# Patient Record
Sex: Female | Born: 1940 | Race: White | Hispanic: No | Smoking: Never smoker
Health system: Southern US, Community
[De-identification: ages and names within clinical notes are randomized; demographics above are authoritative.]

## PROBLEM LIST (undated history)

## (undated) DIAGNOSIS — M8580 Other specified disorders of bone density and structure, unspecified site: Secondary | ICD-10-CM

## (undated) DIAGNOSIS — S92919A Unspecified fracture of unspecified toe(s), initial encounter for closed fracture: Secondary | ICD-10-CM

## (undated) DIAGNOSIS — I447 Left bundle-branch block, unspecified: Secondary | ICD-10-CM

## (undated) DIAGNOSIS — A0472 Enterocolitis due to Clostridium difficile, not specified as recurrent: Secondary | ICD-10-CM

## (undated) HISTORY — PX: ABDOMINAL HYSTERECTOMY: SHX81

## (undated) HISTORY — PX: APPENDECTOMY: SHX54

## (undated) HISTORY — DX: Unspecified fracture of unspecified toe(s), initial encounter for closed fracture: S92.919A

## (undated) HISTORY — PX: BREAST BIOPSY: SHX20

## (undated) HISTORY — PX: TUBAL LIGATION: SHX77

## (undated) HISTORY — DX: Other specified disorders of bone density and structure, unspecified site: M85.80

---

## 1998-03-07 ENCOUNTER — Ambulatory Visit (HOSPITAL_COMMUNITY): Admission: RE | Admit: 1998-03-07 | Discharge: 1998-03-07 | Payer: Self-pay | Admitting: Obstetrics and Gynecology

## 1999-02-26 ENCOUNTER — Ambulatory Visit (HOSPITAL_COMMUNITY): Admission: RE | Admit: 1999-02-26 | Discharge: 1999-02-26 | Payer: Self-pay | Admitting: Surgery

## 1999-07-09 ENCOUNTER — Other Ambulatory Visit: Admission: RE | Admit: 1999-07-09 | Discharge: 1999-07-09 | Payer: Self-pay | Admitting: Obstetrics and Gynecology

## 2000-03-03 ENCOUNTER — Encounter: Payer: Self-pay | Admitting: Obstetrics and Gynecology

## 2000-03-03 ENCOUNTER — Ambulatory Visit (HOSPITAL_COMMUNITY): Admission: RE | Admit: 2000-03-03 | Discharge: 2000-03-03 | Payer: Self-pay | Admitting: Obstetrics and Gynecology

## 2000-04-15 ENCOUNTER — Other Ambulatory Visit: Admission: RE | Admit: 2000-04-15 | Discharge: 2000-04-15 | Payer: Self-pay | Admitting: Obstetrics and Gynecology

## 2000-04-21 ENCOUNTER — Encounter: Admission: RE | Admit: 2000-04-21 | Discharge: 2000-04-21 | Payer: Self-pay | Admitting: Obstetrics and Gynecology

## 2000-04-21 ENCOUNTER — Encounter: Payer: Self-pay | Admitting: Obstetrics and Gynecology

## 2000-12-30 ENCOUNTER — Encounter: Payer: Self-pay | Admitting: Internal Medicine

## 2000-12-30 ENCOUNTER — Ambulatory Visit (HOSPITAL_COMMUNITY): Admission: RE | Admit: 2000-12-30 | Discharge: 2000-12-30 | Payer: Self-pay | Admitting: Internal Medicine

## 2001-03-09 ENCOUNTER — Encounter: Payer: Self-pay | Admitting: Obstetrics and Gynecology

## 2001-03-09 ENCOUNTER — Encounter: Admission: RE | Admit: 2001-03-09 | Discharge: 2001-03-09 | Payer: Self-pay | Admitting: Obstetrics and Gynecology

## 2001-04-06 ENCOUNTER — Other Ambulatory Visit: Admission: RE | Admit: 2001-04-06 | Discharge: 2001-04-06 | Payer: Self-pay | Admitting: Obstetrics and Gynecology

## 2001-04-13 ENCOUNTER — Encounter: Payer: Self-pay | Admitting: Internal Medicine

## 2001-04-13 ENCOUNTER — Ambulatory Visit (HOSPITAL_COMMUNITY): Admission: RE | Admit: 2001-04-13 | Discharge: 2001-04-13 | Payer: Self-pay | Admitting: Internal Medicine

## 2001-04-20 ENCOUNTER — Encounter: Payer: Self-pay | Admitting: Internal Medicine

## 2001-04-20 ENCOUNTER — Ambulatory Visit (HOSPITAL_COMMUNITY): Admission: RE | Admit: 2001-04-20 | Discharge: 2001-04-20 | Payer: Self-pay | Admitting: Internal Medicine

## 2002-03-15 ENCOUNTER — Encounter: Payer: Self-pay | Admitting: Internal Medicine

## 2002-03-15 ENCOUNTER — Encounter: Admission: RE | Admit: 2002-03-15 | Discharge: 2002-03-15 | Payer: Self-pay | Admitting: Internal Medicine

## 2002-09-07 ENCOUNTER — Emergency Department (HOSPITAL_COMMUNITY): Admission: EM | Admit: 2002-09-07 | Discharge: 2002-09-07 | Payer: Self-pay | Admitting: Emergency Medicine

## 2002-10-04 ENCOUNTER — Encounter: Payer: Self-pay | Admitting: Internal Medicine

## 2002-10-04 ENCOUNTER — Emergency Department (HOSPITAL_COMMUNITY): Admission: EM | Admit: 2002-10-04 | Discharge: 2002-10-04 | Payer: Self-pay

## 2002-10-05 ENCOUNTER — Inpatient Hospital Stay (HOSPITAL_COMMUNITY): Admission: EM | Admit: 2002-10-05 | Discharge: 2002-10-08 | Payer: Self-pay | Admitting: Emergency Medicine

## 2002-11-24 ENCOUNTER — Ambulatory Visit (HOSPITAL_COMMUNITY): Admission: RE | Admit: 2002-11-24 | Discharge: 2002-11-24 | Payer: Self-pay | Admitting: Internal Medicine

## 2002-11-24 ENCOUNTER — Encounter: Payer: Self-pay | Admitting: Internal Medicine

## 2002-12-10 ENCOUNTER — Encounter: Payer: Self-pay | Admitting: Internal Medicine

## 2002-12-10 ENCOUNTER — Ambulatory Visit (HOSPITAL_COMMUNITY): Admission: RE | Admit: 2002-12-10 | Discharge: 2002-12-10 | Payer: Self-pay | Admitting: Internal Medicine

## 2002-12-13 ENCOUNTER — Encounter: Payer: Self-pay | Admitting: Internal Medicine

## 2002-12-13 ENCOUNTER — Ambulatory Visit (HOSPITAL_COMMUNITY): Admission: RE | Admit: 2002-12-13 | Discharge: 2002-12-13 | Payer: Self-pay | Admitting: Internal Medicine

## 2002-12-23 ENCOUNTER — Emergency Department (HOSPITAL_COMMUNITY): Admission: EM | Admit: 2002-12-23 | Discharge: 2002-12-23 | Payer: Self-pay | Admitting: Emergency Medicine

## 2003-01-27 ENCOUNTER — Emergency Department (HOSPITAL_COMMUNITY): Admission: EM | Admit: 2003-01-27 | Discharge: 2003-01-27 | Payer: Self-pay

## 2003-03-14 ENCOUNTER — Ambulatory Visit (HOSPITAL_COMMUNITY): Admission: RE | Admit: 2003-03-14 | Discharge: 2003-03-14 | Payer: Self-pay | Admitting: Internal Medicine

## 2003-04-04 ENCOUNTER — Encounter: Admission: RE | Admit: 2003-04-04 | Discharge: 2003-04-04 | Payer: Self-pay | Admitting: Internal Medicine

## 2003-04-04 ENCOUNTER — Encounter: Payer: Self-pay | Admitting: Internal Medicine

## 2003-05-31 ENCOUNTER — Encounter: Payer: Self-pay | Admitting: Surgery

## 2003-05-31 ENCOUNTER — Encounter: Admission: RE | Admit: 2003-05-31 | Discharge: 2003-05-31 | Payer: Self-pay | Admitting: Surgery

## 2004-04-09 ENCOUNTER — Encounter: Admission: RE | Admit: 2004-04-09 | Discharge: 2004-04-09 | Payer: Self-pay | Admitting: Obstetrics and Gynecology

## 2004-08-06 ENCOUNTER — Encounter (HOSPITAL_COMMUNITY): Admission: RE | Admit: 2004-08-06 | Discharge: 2004-09-13 | Payer: Self-pay | Admitting: Internal Medicine

## 2004-09-27 ENCOUNTER — Encounter: Admission: RE | Admit: 2004-09-27 | Discharge: 2004-09-27 | Payer: Self-pay | Admitting: Internal Medicine

## 2004-10-01 ENCOUNTER — Ambulatory Visit (HOSPITAL_COMMUNITY): Admission: RE | Admit: 2004-10-01 | Discharge: 2004-10-01 | Payer: Self-pay | Admitting: Internal Medicine

## 2004-11-26 ENCOUNTER — Ambulatory Visit (HOSPITAL_COMMUNITY): Admission: RE | Admit: 2004-11-26 | Discharge: 2004-11-26 | Payer: Self-pay | Admitting: Gastroenterology

## 2004-11-26 ENCOUNTER — Encounter (INDEPENDENT_AMBULATORY_CARE_PROVIDER_SITE_OTHER): Payer: Self-pay | Admitting: *Deleted

## 2005-04-22 ENCOUNTER — Encounter: Admission: RE | Admit: 2005-04-22 | Discharge: 2005-04-22 | Payer: Self-pay | Admitting: Obstetrics and Gynecology

## 2006-01-15 ENCOUNTER — Encounter: Payer: Self-pay | Admitting: Surgery

## 2006-02-06 ENCOUNTER — Other Ambulatory Visit: Admission: RE | Admit: 2006-02-06 | Discharge: 2006-02-06 | Payer: Self-pay | Admitting: Obstetrics & Gynecology

## 2006-04-28 ENCOUNTER — Encounter: Admission: RE | Admit: 2006-04-28 | Discharge: 2006-04-28 | Payer: Self-pay | Admitting: Obstetrics & Gynecology

## 2006-10-20 ENCOUNTER — Encounter: Admission: RE | Admit: 2006-10-20 | Discharge: 2006-10-20 | Payer: Self-pay | Admitting: Obstetrics & Gynecology

## 2006-12-24 ENCOUNTER — Encounter: Admission: RE | Admit: 2006-12-24 | Discharge: 2006-12-24 | Payer: Self-pay | Admitting: Gastroenterology

## 2007-05-12 ENCOUNTER — Encounter: Admission: RE | Admit: 2007-05-12 | Discharge: 2007-05-12 | Payer: Self-pay | Admitting: Obstetrics & Gynecology

## 2007-10-06 ENCOUNTER — Encounter: Admission: RE | Admit: 2007-10-06 | Discharge: 2007-10-06 | Payer: Self-pay | Admitting: Internal Medicine

## 2008-01-27 ENCOUNTER — Emergency Department (HOSPITAL_COMMUNITY): Admission: EM | Admit: 2008-01-27 | Discharge: 2008-01-27 | Payer: Self-pay | Admitting: Emergency Medicine

## 2008-02-01 ENCOUNTER — Other Ambulatory Visit: Admission: RE | Admit: 2008-02-01 | Discharge: 2008-02-01 | Payer: Self-pay | Admitting: Obstetrics & Gynecology

## 2008-06-06 ENCOUNTER — Encounter: Admission: RE | Admit: 2008-06-06 | Discharge: 2008-06-06 | Payer: Self-pay | Admitting: Obstetrics & Gynecology

## 2008-06-15 ENCOUNTER — Emergency Department (HOSPITAL_COMMUNITY): Admission: EM | Admit: 2008-06-15 | Discharge: 2008-06-15 | Payer: Self-pay | Admitting: Emergency Medicine

## 2008-06-28 ENCOUNTER — Encounter: Admission: RE | Admit: 2008-06-28 | Discharge: 2008-06-28 | Payer: Self-pay | Admitting: Gastroenterology

## 2008-07-19 ENCOUNTER — Emergency Department (HOSPITAL_COMMUNITY): Admission: EM | Admit: 2008-07-19 | Discharge: 2008-07-19 | Payer: Self-pay | Admitting: Emergency Medicine

## 2009-06-07 ENCOUNTER — Encounter: Admission: RE | Admit: 2009-06-07 | Discharge: 2009-06-07 | Payer: Self-pay | Admitting: Obstetrics & Gynecology

## 2010-06-12 ENCOUNTER — Encounter: Admission: RE | Admit: 2010-06-12 | Discharge: 2010-06-12 | Payer: Self-pay | Admitting: Obstetrics and Gynecology

## 2010-10-07 ENCOUNTER — Encounter: Payer: Self-pay | Admitting: Obstetrics & Gynecology

## 2011-02-01 NOTE — Discharge Summary (Signed)
NAME:  Sylvia Kennedy, Sylvia Kennedy                          ACCOUNT NO.:  [id number]   MEDICAL RECORD NO.:  [id number]                   PATIENT TYPE:  INP   LOCATION:  0374                                 FACILITY:  [facility]   PHYSICIAN:  Iva Boop, M.D. [facility]           DATE OF BIRTH:  05-Dec-1940   DATE OF ADMISSION:  10/05/2002  DATE OF DISCHARGE:  10/08/2002                                 DISCHARGE SUMMARY   ADMITTING DIAGNOSES:  50. A 70 year old female with acute upper gastrointestinal bleed status post     upper endoscopy with Elease Hashimoto dilation of the distal esophagus and biopsy     of gastrocardia polyp on October 04, 2002 per Vania Rea. Jarold Motto, M.D.     [facility]  2. Chronic gastroesophageal reflux disease.  3. Status post appendectomy and hysterectomy.   DISCHARGE DIAGNOSES:  1. Stable status post acute upper gastrointestinal bleed secondary to     esophageal laceration at dilation site with associated visible vessel.  2. Anemia secondary to above status post transfusion.  3. Chronic anxiety.  4. Chronic gastroesophageal reflux disease.  5. Status post appendectomy and hysterectomy.   CONSULTATIONS:  None.   PROCEDURE:  Upper endoscopy with epinephrine injection, bicap and Endo  clipping per Iva Boop, M.D. [facility] October 05, 2002.   BRIEF HISTORY:  The patient is a very nice 70 year old white female known to  [employer]. Jarold Motto, M.D. [facility] with history of chronic GERD.  She is status  post appendectomy, bilateral tubal ligation, and hysterectomy.  She had been  on chronic Nexium 40 mg b.i.d.  She had undergone upper endoscopy with Vania Rea. Jarold Motto, M.D. 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  She was also noted to have an  irregular V-line which was biopsied and a small polyp in the gastrocardia  approximately 4 mm and sessile which was removed via hot biopsy.  The  patient tolerated the procedure well.  Was discharged to  home from the  __________.  Last evening she ate dinner and then vomited up a small blood  clot early in the evening.  She called and was advised to come to the  emergency room for evaluation.  She was seen and evaluated by Wilhemina Bonito. Marina Goodell,  M.D. [facility] who was on-call.  Was noted to be hemodynamically quite stable  without any evidence of active bleeding.  Barium swallow was done showing no  evidence of a tear or leak and she was allowed discharge to home.  However,  after returning home she vomited again x2 with small amounts of bright red  blood, a couple of tablespoons each time per the patient, and then started  having loose stools with multiple episodes during the night.  She says all  of these were black.  She eventually developed urgency and clamminess with  these episodes, but no  syncope.  She had progressive weakness and came back  to the emergency room.  She was hemodynamically stable, but tachycardic.  CBC showed a hemoglobin of 12.9, hematocrit of 36.1.  Coags were within  normal limits.  Stool was noted to be black and heme-positive per the ER  physician.  She denied any abdominal pain, chest pain, dysphagia, shortness  of breath, etc.  She was seen and evaluated and admitted to the hospital for  observation and probable repeat upper endoscopy with therapeutic  intervention if indicated.  At the time of admission she was hesitant for  repeat procedure.   LABORATORY STUDIES:  On October 05, 2002 WBC of 9.6, hemoglobin 12.9,  hematocrit 36.1, MCV 86.4, platelets 242,000.  Serial values were obtained  later on January 20.  Hemoglobin down to 10.3, hematocrit of 29.3.  Follow-  up on January 21:  Hemoglobin 11.5, hematocrit 32.7.  She had multiple H&Hs  drawn.  On October 07, 2002 hemoglobin 11.4, hematocrit 33.5.  On January 23  hemoglobin 11.5, hematocrit 32.3.  Pro time 13.7, INR 1.0, PTT 28.  Electrolytes within normal limits.  On admission BUN was 28, creatinine 0.7,  albumin  3.7.  Liver function studies normal with the exception of a total  bilirubin at 1.5, lipase normal at 20.   HOSPITAL COURSE:  The patient was admitted to the service of Iva Boop, M.D. [facility] who was covering the hospital.  She had initially been  evaluated in the emergency room.  She was kept n.p.o. and at bed rest,  placed on IV fluids and IV Protonix.  Initial hemoglobin on the morning of  admission was still within normal limits at 12.9.  We had discussed repeat  upper endoscopy as the primary means for diagnosing and treating her upper  GI bleed.  She was quite anxious and initially hesitant to undergo a repeat  procedure.  We waited a few hours and when it became evident that she was  still having active bleeding and had sustained a drop in her hemoglobin, she  was transfused 2 units of packed rbc's and further discussion was had with  the patient and her husband who at that time were quite willing to proceed  with repeat upper endoscopy.  This was done per Iva Boop, M.D. [facility] on  October 05, 2002 with finding of a laceration at the site of the esophageal  dilation.  There was a visible vessel present.  This was injected with  epinephrine, bicapped, and Endo clipped with successful hemostasis.  There  was some retained blood in the fundus.  There was no evidence of bleeding  site seen in the Cartia from where the polyp was removed and otherwise was  negative examination with the exception of the hiatal hernia.  The patient  was observed overnight in the intensive care unit.  Did not have any further  active bleeding and did not require any further transfusions.  The following  morning she was given sips of clear liquids and then later that day  transferred to a regular floor.  On January 22 she remained quite stable.  Her hemoglobin was stable at 11.6.  She was taking liquids without  difficulty and had no complaints of discomfort.  She was anxious and tearful at that time  and admitting to significant anxiety secondary to this  unanticipated hospital stay.  She was given Tranxene on a p.r.n. basis which  she had to use at home previously.  She was seen by Vania Rea. Jarold Motto, M.D.  [facility] on the morning of January 23 and again was quite stable at that time.  She was able to tolerate full liquids and hemoglobin was quite stable.  It  was felt that she could be discharged to home, but was not feeling up to a  discharge at that time.  She was reassured.  Later in the afternoon had  decided to stay until the following morning.  However, later that evening  after discussion with Iva Boop, M.D. [facility] patient was discharged to  home in a stable and improved condition with instructions to follow up with  Iva Boop, M.D. [facility] on Friday, January 30 at 2 p.m. and to call for  any problems in the interim.  She was to take it easy with no strenuous  exercise or heavy lifting for two weeks.  She was asked to stay on a full  liquid diet for 24 hours and then advance to a soft diet for two to three  days and then regular thereafter as she tolerated.   DISCHARGE MEDICATIONS:  1. Protonix 40 mg b.i.d. or Nexium 40 mg b.i.d. which she had had at home     previously.  2. Vivelle patch as previous.  3. Vitamins as previous.  4. Tranxene 3.75 mg p.o. t.i.d. p.r.n.  5. No aspirin, Advil, Aleve, or other anti-inflammatory medications.   She was asked to have a CBC drawn at the [facility] office laboratory prior to  her appointment on January 30.     Mike Gip, P.A.-C. [facility]                Iva Boop, M.D. [facility]    AE/MEDQ  D:  10/25/2002  T:  10/25/2002  Job:  [id number]

## 2011-02-01 NOTE — H&P (Signed)
NAMESOLE, LENGACHER                          ACCOUNT NO.:  [id number]   MEDICAL RECORD NO.:  [id number]                   PATIENT TYPE:  INP   LOCATION:  0152                                 FACILITY:  [facility]   PHYSICIAN:  Iva Boop, M.D. [facility]           DATE OF BIRTH:  03-08-1941   DATE OF ADMISSION:  10/05/2002  DATE OF DISCHARGE:                                HISTORY & PHYSICAL   CHIEF COMPLAINT:  Vomited blood and black stool, associated with weakness.   HISTORY:  Ms. Politano is a very nice, 70 year old, white female, known to  [employer]. Jarold Motto, M.D. with history of chronic GERD.  She is status post  appendectomy, bilateral tubal ligation, and hysterectomy.  She has been on  chronic Nexium 40 daily to b.i.d.  She underwent upper endoscopy at the [facility]  on 10/04/02 and had Maloney dilation done of a distal esophageal stricture  to 18 mm.  She was also noted to have an irregular Z-line which was biopsied  and a small polyp in the gastric cardia, 4 mm, sessile, which was removed  via hot biopsy.  The patient did well postprocedure and was discharged to  home.  Last evening she ate dinner and then vomited a small blood clot.  She  called and was advised to come to the emergency room for evaluation.  She  was seen and evaluated by Wilhemina Bonito. Marina Goodell, M.D., who was on call.  She was  hemodynamically quite stable.  Barium swallow was done showing no evidence  of tear or leak, and the patient was discharged to home.  After returning  home, she vomited again x 2 with small amounts of bright red blood,  approximately a couple of tablespoons per the patient.  She then started  with loose stools x 7 during the night, all of which were black.  She had  urgency and clamminess with these episodes but no syncope.  She says she  felt weak and a little fainty at times.  She came back to the emergency  room this morning, hemodynamically remains stable but has been tachycardic.  CBC shows a WBC of  9.6, hemoglobin 12.9, hematocrit 36.1.  Coags within  normal limits.  Stool is black and heme-positive per the ER physician,  Carren Rang, M.D.  She denies any abdominal pain, chest pain, dysphagia,  dyspnea, etc.  She is seen and evaluated and is admitted for observation and  probable repeat upper endoscopy with therapeutic intervention with upper GI  bleed post esophageal dilation.  It is unclear at this time whether she is  bleeding from a Mallory-Weiss type tear postdilation of the esophagus or  from the polyp site.   CURRENT MEDICATIONS:  1. Nexium 40 p.o. daily to b.i.d.  2. Vivelle patch.  3. She also on her medication list put occasional aspirin and Advil but none     over the past few  days.   ALLERGIES:  DARVOCET.   PAST MEDICAL HISTORY:  1. GERD.  2. Bilateral tubal ligation.  3. Status post hysterectomy and appendectomy.   FAMILY HISTORY:  Two grandmothers deceased with coronary artery disease.  Mother alive and well at 3.   SOCIAL HISTORY:  The patient is married.  No tobacco or ETOH.  She is  employed as a Associate Professor.   REVIEW OF SYSTEMS:  CARDIOVASCULAR:  Negative for chest pain or anginal  symptoms.  PULMONARY:  Negative for cough, shortness of breath, or sputum  production.  GENITOURINARY:  Negative for dysuria, urgency, or frequency.  HEENT:  Unremarkable.  MUSCULOSKELETAL:  Negative currently.  GI:  As above.   PHYSICAL EXAMINATION:  GENERAL:  Well-developed, white female, in no acute  distress.  She is alert and oriented x 3.  She is anxious.  VITAL SIGNS:  Temperature 97.2, blood pressure 136/60, pulse currently 90  was up to 110.  HEENT:  Nontraumatic, normocephalic.  EOMI.  PERRLA.  Sclerae anicteric.  NECK:  Supple and without nodes.  There is no JVD or bruit.  No crepitus.  CARDIOVASCULAR:  Regular rate and rhythm with S1 and S2.  No murmurs, rubs,  or gallops, tachy.  PULMONARY:  Clear.  No crepitus of chest wall.  ABDOMEN:  Soft and nontender.   There is no mass or hepatosplenomegaly.  Bowel sounds are active.  RECTAL:  Not repeated at this time.  Black, melenic stool per ER physician.  EXTREMITIES:  Without cyanosis, clubbing, or edema.  NEUROLOGIC:  Grossly nonfocal.   IMPRESSION:  3. A 70 year old white female with acute upper gastrointestinal bleed post     upper endoscopy with Maloney dilation of the distal esophagus and biopsy     of gastric cardia polyp on 10/04/02.  2. Chronic gastroesophageal reflux disease.  3. Status post appendectomy and hysterectomy.   PLAN:  The patient is admitted to the service of Iva Boop, M.D. for  IV fluid hydration, serial H&H's, IV Protonix, probable EGD with therapeutic  intervention today.  For details, please see the orders.     Mike Gip, P.A.-C. [facility]                Iva Boop, M.D. [facility]    AE/MEDQ  D:  10/05/2002  T:  10/05/2002  Job:  [id number]

## 2011-02-01 NOTE — Consult Note (Signed)
NAME:  Sylvia Kennedy, Sylvia Kennedy NO.:  [id number]   MEDICAL RECORD NO.:  [id number]                   PATIENT TYPE:  EMS   LOCATION:  ED                                   [facility]ACILITY[facility]  [facility]   PHYSICIAN:  Iva Boop, M.D. [facility]           DATE OF BIRTH:  05/21/1941   DATE OF CONSULTATION:  12/23/2002  DATE OF DISCHARGE:                                   CONSULTATION   GASTROENTEROLOGY CONSULTATION:   HISTORY:  The patient is a pleasant 70 year old white woman that I know from  previous endoscopy after she suffered bleeding from esophageal laceration  after endoscopy, biopsy, and dilation of the esophagus.  Ever since that  time she has had recurrent reflux-type symptoms and anxiety as well.  She  says that her symptoms are all worse since that episode approximately 6-8  weeks ago.  I do not have the records in front of me at this time.  She has  switched from various proton pump inhibitors, she has had a lot of burning  sensation in the mouth and the tongue without obvious pathology.  At one  point I thought I might have seen a small ulcer in her pharynx and she was  sent to Dr. Pollyann Kennedy of otolaryngology and from what she tells me he did not  think there was anything seriously wrong though her problems were related to  reflux.  She also has thyroid nodules and hyperthyroidism and is due to  follow up with Dr. Felipa Eth on that.  Saturday, about 6 days ago, she had an  episode of severe burning in the chest up into the throat and the nose area,  she used some Maalox and felt somewhat better.  She has recently switched  back to Protonix from Nexium and she is taking Protonix 40 mg b.i.d.  She  was also started on Lexapro recently that was recommended weeks ago and she  finally started that and overnight she developed burning in her chest wall  and her arms and really more of a hot sensation.  She said her arms and neck  and face were somewhat red according to her  husband.  That has resolved.   PHYSICAL EXAMINATION:  GENERAL:  She is in no distress at this time.  VITAL SIGNS:  Temperature 98.2, pulse 78, blood pressure 136/73,  respirations 18.  LUNGS:  Clear.  NECK:  Supple; no obvious mass or tenderness.  HEENT:  The mouth and posterior pharynx look free of lesions.  HEART:  S1 and S2; no rubs, murmurs, or gallops.   REVIEW OF SYSTEMS:  She denies any particular dyspnea or palpitations or  tachycardia.   ASSESSMENT:  1. Gastroesophageal reflux disease.  2. Anxiety.  3. Hyperthyroidism, per the patient's history.  4. Adverse reaction/side effects to Lexapro is the most likely cause of the     symptoms she had overnight.   PLAN:  This is somewhat complicated.  Clearly her symptoms are a lot worse  since her endoscopic complication and I think there is a certain amount of  anxiety and/or posttraumatic stress disorder problems related to that.  She  does have acid reflux and short segment Barrett's esophagus.  I am not  convinced all of her symptoms are related to reflux.  I have suggested the  possibility of a pH probe test but I think she should have her thyroid  issues addressed first as that can certainly contribute to some of her  problems and ultimately I think she would be best served by  some sort of anxiolytic/antidepressant.  I have recommended she increase her  Tranxene to a half a tablet b.i.d.  She says a whole tablet (currently  taking half tablet once a day) causes too much of an effect.  She has an  established followup with me later this month and she can call if problems  arise sooner.                                               Iva Boop, M.D. [facility]    CEG/MEDQ  D:  12/23/2002  T:  12/24/2002  Job:  [id number]   cc:   Larina Earthly, M.D.  [address]  [place]  [place] [address]  Fax: [phone]   Jeannett Senior. Pollyann Kennedy, M.D.  [address] [place]  [place] [address]  Fax: [phone]

## 2011-02-01 NOTE — Op Note (Signed)
Sylvia Kennedy, LANUM              ACCOUNT NO.:  [id number]   MEDICAL RECORD NO.:  [id number]          PATIENT TYPE:  AMB   LOCATION:  ENDO                         FACILITY:  [facility]   PHYSICIAN:  Bernette Redbird, M.D.   DATE OF BIRTH:  1941-03-21   DATE OF PROCEDURE:  11/26/2004  DATE OF DISCHARGE:                                 OPERATIVE REPORT   PROCEDURE:  Upper endoscopy with biopsies.   INDICATION:  Questionable Barrett's esophagus with prior biopsies not  showing any intestinal metaplasia but with history of reflux, on chronic  Nexium. Confirm absence of intestinal metaplasia.   FINDINGS:  Minimal hiatal hernia. No endoscopic evidence of Barrett's, but  hiatal hernia mucosa biopsied.   PROCEDURE:  The nature, purpose and risks of the procedure had been  discussed with the patient, who provided written consent. Sedation was  fentanyl 75 mcg and Versed 8 milligrams IV without arrhythmias or  desaturation. The Olympus standard adult video endoscope was passed under  direct vision, entering the esophagus with mild resistance.   The esophageal mucosa was normal down to the squamocolumnar junction. There  was no evidence of free reflux, reflux esophagitis, nor any tongues of  Barrett's appearing mucosa, stricture, ring, varices, infection or  neoplasia. There was a 1 to 2 cm hiatal hernia present with circumferential  gastric mucosa comprising what appeared to be the hiatal hernia pouch, but  without any obvious Barrett's changes, so I am quite sure this was, indeed,  a hiatal hernia and not Barrett's mucosa. Nonetheless, sampling was obtained  to confirm that it was typical gastric cardia columnar mucosa without  intestinal metaplasia.   The stomach contained no significant no significant residual and had normal  mucosa without evidence of gastritis, erosions, ulcers, polyps or masses  including a retroflexed view of the cardia, and the pylorus, duodenal bulb  and second  duodenum looked normal.   The patient tolerated the procedure well and there was no apparent  complications.   IMPRESSION:  Small hiatal hernia but without classic tongues of Barrett's  mucosa.   PLAN:  Await pathology results.      RB/MEDQ  D:  11/26/2004  T:  11/26/2004  Job:  [id number]   cc:   Larina Earthly, M.D.  [address]  [place]  [place] [address]  Fax: [phone]

## 2011-02-01 NOTE — Op Note (Signed)
   NAME:  Sylvia Kennedy, Sylvia Kennedy                          ACCOUNT NO.:  [id number]   MEDICAL RECORD NO.:  [id number]                   PATIENT TYPE:  AMB   LOCATION:  ENDO                                 FACILITY:  [facility]   PHYSICIAN:  Iva Boop, M.D. [facility]           DATE OF BIRTH:  Jul 05, 1941   DATE OF PROCEDURE:  DATE OF DISCHARGE:                                 OPERATIVE REPORT   PROCEDURE:  24 hour ambulatory pH monitoring.   INDICATIONS FOR PROCEDURE:  Pharyngeal sensations, question proximal reflux.  The patient is on Prevacid 30 mg b.i.d.  She has had problems with dysphagia  in the past as well and odynophagia type symptoms. A dual sensor (15 cm  spacing) probe placed with the distal sensor at 33 cm.   FINDINGS:  There was no significant acid reflux recorded on either probe. It  was analyzed for 22 hours and 37 minutes. There were two reflux episodes  total in the proximal and one reported in the distal probe. These were very  transient lasting for less than one minute each. There was on significant  correlation with symptoms of heartburn or chest pain and the patient's  symptoms. She had no other symptoms. She had one episode of chest pain  reported and several episodes of heartburn which did not correlate with  reflux.   ASSESSMENT:  No significant acid reflux in a patient on twice daily proton  pump inhibitor therapy with persistent pharyngeal symptoms thought possibly  to be due to reflux. I think that is not the case.   RECOMMENDATIONS:  Given the patient's history of anxiety, I will ask the  patient to see counseling. I think this may help her with some of her  symptoms. Please see her esophageal manometry report as well.   I appreciate the opportunity to care for this patient.                                               Iva Boop, M.D. [facility]    CEG/MEDQ  D:  03/28/2003  T:  03/28/2003  Job:  [id number]   cc:   Larina Earthly, M.D.  [address]  [place]  [place] [address]  Fax: [phone]

## 2011-02-01 NOTE — Op Note (Signed)
   Sylvia Kennedy, Sylvia Kennedy                          ACCOUNT NO.:  [id number]   MEDICAL RECORD NO.:  [id number]                   PATIENT TYPE:  AMB   LOCATION:  ENDO                                 FACILITY:  [facility]   PHYSICIAN:  Iva Boop, M.D. [facility]           DATE OF BIRTH:  [birth year]   DATE OF PROCEDURE:  03/14/2003  DATE OF DISCHARGE:                                 OPERATIVE REPORT   PROCEDURE:  Esophageal manometry.   INDICATION:  Globus sensation.  Continued problems with possible atypical  reflux with pharyngeal sensations of uncertain etiology, despite proton pump  inhibitor therapy.   FINDINGS:  1. Lower esophageal sphincter pressure is normal at 36.5 mm.  Residual     pressure is reported at 8.4 mmHg, which is slightly high.  A percent     relaxation is indicated at 66%, which technically would be low, but     review of the tracing shows adequate relaxation in my opinion.  The     tracing did not plot out within the bars on the typical graft, there is     significant relaxation of the lower esophageal sphincter, in my opinion.  2. Lower esophageal sphincter length is 3 cm, ranging from 41-44 cm.  3. Esophageal body.  Both lower and upper esophageal body shows normal     peristalsis and appropriate amplitude of contractions.  There was one     nontransmitted contraction in the lower esophageal body and one     simultaneous contraction.  4. Upper esophageal sphincter shows a high pressure at 170.7 mmHg and a high     peak pressure in the pharynx of 191.8.  This is just slightly high with     top-normal being 190.  Percent relaxation in the upper esophageal     sphincter is 96%.   ASSESSMENT:  High pharyngeal and upper esophageal sphincter pressures, which  would correlate with the patient's sensation of globus that has been  reported.  The etiology of this is not clear at this point.  Based upon my  clinical analysis, I think the patient has underlying anxiety, which  has  been correlated with symptoms and problems such as this.                                               Iva Boop, M.D. [facility]    CEG/MEDQ  D:  03/28/2003  T:  03/28/2003  Job:  [id number]   cc:   Larina Earthly, M.D.  [address]  [place]  [place] [address]  Fax: [phone]

## 2011-05-09 ENCOUNTER — Other Ambulatory Visit: Payer: Self-pay | Admitting: Obstetrics & Gynecology

## 2011-05-09 DIAGNOSIS — Z1231 Encounter for screening mammogram for malignant neoplasm of breast: Secondary | ICD-10-CM

## 2011-06-17 ENCOUNTER — Ambulatory Visit
Admission: RE | Admit: 2011-06-17 | Discharge: 2011-06-17 | Disposition: A | Discharge status: Home / Self Care | Payer: Medicare Other | Source: Ambulatory Visit | Attending: Obstetrics & Gynecology | Admitting: Obstetrics & Gynecology

## 2011-06-17 DIAGNOSIS — Z1231 Encounter for screening mammogram for malignant neoplasm of breast: Secondary | ICD-10-CM

## 2011-06-17 LAB — POCT I-STAT, CHEM 8
Calcium, Ion: 1.27
HCT: 49 — ABNORMAL HIGH
Hemoglobin: 16.7 — ABNORMAL HIGH
TCO2: 28

## 2011-06-17 LAB — DIFFERENTIAL
Basophils Absolute: 0
Basophils Relative: 0
Eosinophils Relative: 0
Monocytes Absolute: 0.8

## 2011-06-17 LAB — CBC
HCT: 45.9
Hemoglobin: 15.6 — ABNORMAL HIGH
MCHC: 34.1
RDW: 13.1

## 2011-06-17 LAB — URINALYSIS, ROUTINE W REFLEX MICROSCOPIC
Hgb urine dipstick: NEGATIVE
Nitrite: NEGATIVE
Protein, ur: NEGATIVE
Urobilinogen, UA: 0.2

## 2011-06-17 LAB — URINE MICROSCOPIC-ADD ON

## 2011-06-18 LAB — DIFFERENTIAL
Basophils Relative: 0
Lymphocytes Relative: 18
Lymphs Abs: 1.4
Monocytes Absolute: 0.5
Monocytes Relative: 7
Neutro Abs: 5.9
Neutrophils Relative %: 75

## 2011-06-18 LAB — POCT I-STAT, CHEM 8
BUN: 11
Calcium, Ion: 1.1 — ABNORMAL LOW
Chloride: 105
Creatinine, Ser: 0.9
Glucose, Bld: 107 — ABNORMAL HIGH
HCT: 48 — ABNORMAL HIGH
Hemoglobin: 16.3 — ABNORMAL HIGH
Potassium: 4.1
Sodium: 140
TCO2: 27

## 2011-06-18 LAB — POCT CARDIAC MARKERS
CKMB, poc: <1 — ABNORMAL LOW
Troponin i, poc: <0.05

## 2011-06-18 LAB — CBC
Hemoglobin: 16.2 — ABNORMAL HIGH
RBC: 5.33 — ABNORMAL HIGH
WBC: 7.9

## 2011-10-30 ENCOUNTER — Encounter (HOSPITAL_COMMUNITY): Payer: Self-pay | Admitting: *Deleted

## 2011-10-30 ENCOUNTER — Emergency Department (HOSPITAL_COMMUNITY)
Admission: EM | Admit: 2011-10-30 | Discharge: 2011-10-30 | Disposition: A | Discharge status: Home / Self Care | Payer: Medicare Other | Attending: Emergency Medicine | Admitting: Emergency Medicine

## 2011-10-30 ENCOUNTER — Emergency Department (HOSPITAL_COMMUNITY): Payer: Medicare Other

## 2011-10-30 DIAGNOSIS — R109 Unspecified abdominal pain: Secondary | ICD-10-CM | POA: Insufficient documentation

## 2011-10-30 DIAGNOSIS — N390 Urinary tract infection, site not specified: Secondary | ICD-10-CM | POA: Insufficient documentation

## 2011-10-30 DIAGNOSIS — N949 Unspecified condition associated with female genital organs and menstrual cycle: Secondary | ICD-10-CM | POA: Insufficient documentation

## 2011-10-30 DIAGNOSIS — N3289 Other specified disorders of bladder: Secondary | ICD-10-CM | POA: Insufficient documentation

## 2011-10-30 DIAGNOSIS — R350 Frequency of micturition: Secondary | ICD-10-CM | POA: Insufficient documentation

## 2011-10-30 LAB — URINALYSIS, ROUTINE W REFLEX MICROSCOPIC
Bilirubin Urine: NEGATIVE
Ketones, ur: NEGATIVE mg/dL
Nitrite: POSITIVE — AB
Specific Gravity, Urine: 1.009 (ref 1.005–1.030)
Urobilinogen, UA: 1 mg/dL (ref 0.0–1.0)

## 2011-10-30 LAB — COMPREHENSIVE METABOLIC PANEL
ALT: 16 U/L (ref 0–35)
AST: 24 U/L (ref 0–37)
Albumin: 4.1 g/dL (ref 3.5–5.2)
Alkaline Phosphatase: 74 U/L (ref 39–117)
Chloride: 104 mEq/L (ref 96–112)
Potassium: 3.8 mEq/L (ref 3.5–5.1)
Sodium: 141 mEq/L (ref 135–145)
Total Protein: 7.5 g/dL (ref 6.0–8.3)

## 2011-10-30 LAB — DIFFERENTIAL
Basophils Relative: 1 % (ref 0–1)
Eosinophils Absolute: 0 10*3/uL (ref 0.0–0.7)
Neutro Abs: 5.5 10*3/uL (ref 1.7–7.7)
Neutrophils Relative %: 69 % (ref 43–77)

## 2011-10-30 LAB — CBC
MCH: 30.7 pg (ref 26.0–34.0)
MCHC: 35.8 g/dL (ref 30.0–36.0)
Platelets: 250 10*3/uL (ref 150–400)
RBC: 5.11 MIL/uL (ref 3.87–5.11)

## 2011-10-30 MED ORDER — CEFTRIAXONE SODIUM 1 G IJ SOLR
1.0000 g | Freq: Once | INTRAMUSCULAR | Status: AC
  Administered 2011-10-30: 1 g via INTRAVENOUS
  Filled 2011-10-30: qty 10

## 2011-10-30 NOTE — ED Provider Notes (Signed)
History     CSN: [id number]  Arrival date & time 10/30/11  1045   First MD Initiated Contact with Patient 10/30/11 1114      Chief Complaint  Patient presents with  . Urinary Tract Infection    (Consider location/radiation/quality/duration/timing/severity/associated sxs/prior treatment) Patient is a 71 y.o. female presenting with urinary tract infection.  Urinary Tract Infection This is a new problem. The current episode started more than 1 week ago. Pertinent negatives include no abdominal pain.  Pt recently diagnosed with UTI land started on Cipro 5 days ago by PMD. Pt still report suprapubic spasms and frequency despite abx. No fever, chills, vaginal bleeding or d/c, flank pain, N/V/D, abd pain.   History reviewed. No pertinent past medical history.  Past Surgical History  Procedure Date  . Abdominal hysterectomy   . Appendectomy     No family history on file.  History  Substance Use Topics  . Smoking status: Never Smoker   . Smokeless tobacco: Not on file  . Alcohol Use: No    OB History    Grav Para Term Preterm Abortions TAB SAB Ect Mult Living                  Review of Systems  Constitutional: Negative for fever and chills.  Gastrointestinal: Negative for nausea, vomiting, abdominal pain, diarrhea and rectal pain.  Genitourinary: Positive for frequency and pelvic pain. Negative for flank pain, vaginal bleeding, vaginal discharge, difficulty urinating and vaginal pain.  Musculoskeletal: Negative for back pain.    Allergies  Darvon  Home Medications   Current Outpatient Rx  Name Route Sig Dispense Refill  . VITAMIN D 1000 UNITS PO TABS Oral Take 1,000 Units by mouth daily.    Marland Kitchen CIPROFLOXACIN HCL 500 MG PO TABS Oral Take 500 mg by mouth 2 (two) times daily. Pt finished on 10-30-11 for 5 day therapy.    Marland Kitchen DIAZEPAM 5 MG PO TABS Oral Take 2.5 mg by mouth at bedtime as needed. Pt takes 1/2 tab at bedtime for 2.5 mg dose anxiety    . OMEGA-3 FATTY ACIDS 1000  MG PO CAPS Oral Take 1 g by mouth daily.    . MULTI-VITAMIN/MINERALS PO TABS Oral Take 1 tablet by mouth daily.    Marland Kitchen OMEPRAZOLE 20 MG PO CPDR Oral Take 20 mg by mouth daily.    Marland Kitchen PHENAZOPYRIDINE HCL 100 MG PO TABS Oral Take 100 mg by mouth 2 (two) times daily. For 5 days. Started on 10-30-11    . TERCONAZOLE 0.4 % VA CREA Vaginal Place 1 applicator vaginally at bedtime. Therapy for 7 days. Pt is day 5 of therapy.      BP 152/84  Pulse 90  Temp(Src) 98.3 F (36.8 C) (Oral)  Resp 16  SpO2 100%  Physical Exam  Nursing note and vitals reviewed. Constitutional: She is oriented to person, place, and time. She appears well-developed and well-nourished. No distress.  HENT:  Head: Normocephalic and atraumatic.  Mouth/Throat: Oropharynx is clear and moist.  Eyes: EOM are normal. Pupils are equal, round, and reactive to light.  Neck: Normal range of motion. Neck supple.  Cardiovascular: Normal rate and regular rhythm.   Pulmonary/Chest: Effort normal and breath sounds normal. No respiratory distress. She has no wheezes. She has no rales.  Abdominal: Soft. Bowel sounds are normal. She exhibits no mass. There is no tenderness. There is no rebound and no guarding.  Musculoskeletal: Normal range of motion. She exhibits tenderness (mild R flank tenderness  to percussion). She exhibits no edema.  Neurological: She is alert and oriented to person, place, and time.  Skin: Skin is warm and dry. No rash noted. No erythema.  Psychiatric: She has a normal mood and affect. Her behavior is normal.    ED Course  Procedures (including critical care time)  Labs Reviewed  URINALYSIS, ROUTINE W REFLEX MICROSCOPIC - Abnormal; Notable for the following:    Color, Urine ORANGE (*) BIOCHEMICALS MAY BE AFFECTED BY COLOR   Nitrite POSITIVE (*)    Leukocytes, UA SMALL (*)    All other components within normal limits  CBC - Abnormal; Notable for the following:    Hemoglobin 15.7 (*)    All other components within  normal limits  COMPREHENSIVE METABOLIC PANEL - Abnormal; Notable for the following:    Total Bilirubin 1.6 (*)    GFR calc non Af Amer 88 (*)    All other components within normal limits  DIFFERENTIAL  URINE MICROSCOPIC-ADD ON  URINE CULTURE   US Renal  10/30/2011  *RADIOLOGY REPORT*  Clinical Data: Urinary tract infection.  RENAL/URINARY TRACT ULTRASOUND COMPLETE  Comparison:  CT abdomen pelvis 06/28/2008 and abdominal ultrasound 12/24/2006.  Findings:  Right Kidney:  Measures 9.5 cm with uniform parenchymal echogenicity.  No hydronephrosis.  Left Kidney:  Measures 10.3 cm with uniform parenchymal echogenicity.  No hydronephrosis.  Bladder:  Normal.  IMPRESSION: Negative.  Original Report Authenticated By: Reyes Ivan, M.D.     1. UTI (lower urinary tract infection)   2. Bladder spasms       MDM          Loren Racer, MD 10/30/11 1459

## 2011-10-30 NOTE — ED Notes (Signed)
US at bedside

## 2011-10-30 NOTE — ED Notes (Signed)
Pt reports suprapubic pain with certain movements, burning sensation, denies this pain with urination. Urinary frequency. Saw PCP Friday, started on Cipro Friday for UTI and dx with vaginal dryness and yeast. Started pyridium today.

## 2011-10-30 NOTE — Discharge Instructions (Signed)

## 2011-10-31 LAB — URINE CULTURE
Colony Count: NO GROWTH
Culture  Setup Time: 201302140121
Culture: NO GROWTH

## 2011-11-02 ENCOUNTER — Emergency Department (HOSPITAL_COMMUNITY)
Admission: EM | Admit: 2011-11-02 | Discharge: 2011-11-02 | Disposition: A | Discharge status: Home / Self Care | Payer: Medicare Other | Attending: Emergency Medicine | Admitting: Emergency Medicine

## 2011-11-02 ENCOUNTER — Emergency Department (HOSPITAL_COMMUNITY): Payer: Medicare Other

## 2011-11-02 ENCOUNTER — Encounter (HOSPITAL_COMMUNITY): Payer: Self-pay

## 2011-11-02 DIAGNOSIS — R110 Nausea: Secondary | ICD-10-CM | POA: Insufficient documentation

## 2011-11-02 DIAGNOSIS — R109 Unspecified abdominal pain: Secondary | ICD-10-CM | POA: Insufficient documentation

## 2011-11-02 DIAGNOSIS — Z90710 Acquired absence of both cervix and uterus: Secondary | ICD-10-CM | POA: Insufficient documentation

## 2011-11-02 DIAGNOSIS — M51379 Other intervertebral disc degeneration, lumbosacral region without mention of lumbar back pain or lower extremity pain: Secondary | ICD-10-CM | POA: Insufficient documentation

## 2011-11-02 DIAGNOSIS — M5137 Other intervertebral disc degeneration, lumbosacral region: Secondary | ICD-10-CM | POA: Insufficient documentation

## 2011-11-02 DIAGNOSIS — R197 Diarrhea, unspecified: Secondary | ICD-10-CM

## 2011-11-02 LAB — DIFFERENTIAL
Basophils Absolute: 0.1 K/uL (ref 0.0–0.1)
Basophils Relative: 1 % (ref 0–1)
Eosinophils Absolute: 0 10*3/uL (ref 0.0–0.7)
Eosinophils Relative: 0 % (ref 0–5)
Lymphocytes Relative: 17 % (ref 12–46)
Lymphs Abs: 1.6 K/uL (ref 0.7–4.0)
Monocytes Absolute: 0.9 K/uL (ref 0.1–1.0)
Monocytes Relative: 9 % (ref 3–12)
Neutro Abs: 6.8 K/uL (ref 1.7–7.7)
Neutrophils Relative %: 73 % (ref 43–77)

## 2011-11-02 LAB — COMPREHENSIVE METABOLIC PANEL WITH GFR
ALT: 16 U/L (ref 0–35)
AST: 23 U/L (ref 0–37)
CO2: 27 meq/L (ref 19–32)
Calcium: 9.7 mg/dL (ref 8.4–10.5)
Chloride: 100 meq/L (ref 96–112)
Creatinine, Ser: 0.69 mg/dL (ref 0.50–1.10)
GFR calc Af Amer: >90 mL/min (ref >90)
GFR calc non Af Amer: 87 mL/min — ABNORMAL LOW (ref >90)
Glucose, Bld: 81 mg/dL (ref 70–99)
Sodium: 137 meq/L (ref 135–145)
Total Bilirubin: 1.4 mg/dL — ABNORMAL HIGH (ref 0.3–1.2)

## 2011-11-02 LAB — CBC
HCT: 45.1 % (ref 36.0–46.0)
Hemoglobin: 16 g/dL — ABNORMAL HIGH (ref 12.0–15.0)
MCH: 30.9 pg (ref 26.0–34.0)
MCHC: 35.5 g/dL (ref 30.0–36.0)
MCV: 87.1 fL (ref 78.0–100.0)
Platelets: 236 10*3/uL (ref 150–400)
RBC: 5.18 MIL/uL — ABNORMAL HIGH (ref 3.87–5.11)
RDW: 12.9 % (ref 11.5–15.5)
WBC: 9.4 K/uL (ref 4.0–10.5)

## 2011-11-02 LAB — COMPREHENSIVE METABOLIC PANEL
Albumin: 4 g/dL (ref 3.5–5.2)
Alkaline Phosphatase: 81 U/L (ref 39–117)
BUN: 10 mg/dL (ref 6–23)
Potassium: 3.1 mEq/L — ABNORMAL LOW (ref 3.5–5.1)
Total Protein: 7.6 g/dL (ref 6.0–8.3)

## 2011-11-02 LAB — URINE MICROSCOPIC-ADD ON

## 2011-11-02 LAB — URINALYSIS, ROUTINE W REFLEX MICROSCOPIC
Bilirubin Urine: NEGATIVE
Glucose, UA: NEGATIVE mg/dL
Hgb urine dipstick: NEGATIVE
Ketones, ur: NEGATIVE mg/dL
Nitrite: POSITIVE — AB
Protein, ur: NEGATIVE mg/dL
Specific Gravity, Urine: 1.009 (ref 1.005–1.030)
Urobilinogen, UA: 0.2 mg/dL (ref 0.0–1.0)
pH: 6.5 (ref 5.0–8.0)

## 2011-11-02 MED ORDER — IOHEXOL 300 MG/ML  SOLN
100.0000 mL | Freq: Once | INTRAMUSCULAR | Status: AC | PRN
  Administered 2011-11-02: 100 mL via INTRAVENOUS

## 2011-11-02 NOTE — ED Notes (Signed)
CT tech to bedside to provide 2 glasses of CT contrast, pt sipping slowly.

## 2011-11-02 NOTE — ED Provider Notes (Signed)
History     CSN: [id number]  Arrival date & time 11/02/11  0806   First MD Initiated Contact with Patient 11/02/11 [phone]      Chief Complaint  Patient presents with  . Abdominal Pain    (Consider location/radiation/quality/duration/timing/severity/associated sxs/prior treatment) HPI Comments: Patient reports a intermittent discomfort in her right lower quadrant and pelvic region for the past week. She reports the pain intensifies after voiding urine. She was seen by her GYN physician after symptom onset and was told that she had a urinary tract infection. She was given Pyridium as well as Cipro. She has not completed the Cipro antibiotic but continues to have some discomfort. She was seen in the emergency department 2 days ago and had a negative renal ultrasound. She was given a dose of IV Rocephin and discharged to home. She reports that she felt somewhat improved yesterday, however today she developed the same problem again after voiding. She reports that she's had watery diarrhea about 4 or 5 episodes per day since starting the Cipro antibiotic. She reports that she has also been continuing to drink excessive amounts of fluids including cranberry juice. The patient's spouse tells me aside the patient does have a tendency for some anxiety issues. Patient reports some nausea immediately but no change in her appetite or diet and also reports some cold chills but no obvious fevers. She is status post hysterectomy and appendectomy in the past. She denies a history of kidney stones or family history of kidney stones. She reports eating food does not change the discomfort. She denies walking or movement exacerbating the symptoms. She denies any vomiting or chest pain or shortness of breath.  Patient is a 71 y.o. female presenting with abdominal pain. The history is provided by the patient and the spouse.  Abdominal Pain The primary symptoms of the illness include abdominal pain, nausea and diarrhea.  The primary symptoms of the illness do not include fever or dysuria.    History reviewed. No pertinent past medical history.  Past Surgical History  Procedure Date  . Abdominal hysterectomy   . Appendectomy     History reviewed. No pertinent family history.  History  Substance Use Topics  . Smoking status: Never Smoker   . Smokeless tobacco: Not on file  . Alcohol Use: No    OB History    Grav Para Term Preterm Abortions TAB SAB Ect Mult Living                  Review of Systems  Constitutional: Negative for fever.  Gastrointestinal: Positive for nausea, abdominal pain and diarrhea.  Genitourinary: Negative for dysuria.  All other systems reviewed and are negative.    Allergies  Darvon  Home Medications   Current Outpatient Rx  Name Route Sig Dispense Refill  . VITAMIN D 1000 UNITS PO TABS Oral Take 1,000 Units by mouth daily.    Marland Kitchen DIAZEPAM 5 MG PO TABS Oral Take 2.5 mg by mouth at bedtime as needed. Pt takes 1/2 tab at bedtime for 2.5 mg dose anxiety    . ESTRADIOL 0.1 MG/GM VA CREA Vaginal Place 2 g vaginally daily.    . OMEGA-3 FATTY ACIDS 1000 MG PO CAPS Oral Take 1 g by mouth daily.    . MULTI-VITAMIN/MINERALS PO TABS Oral Take 1 tablet by mouth daily.    Marland Kitchen OMEPRAZOLE 20 MG PO CPDR Oral Take 20 mg by mouth daily.    Marland Kitchen PHENAZOPYRIDINE HCL 100 MG PO TABS Oral  Take 100 mg by mouth 3 (three) times daily as needed. For bladder      BP 160/64  Pulse 98  Temp(Src) 98.5 F (36.9 C) (Oral)  Resp 18  Ht 5\' 3"  (1.6 m)  Wt 127 lb (57.607 kg)  BMI 22.50 kg/m2  SpO2 98%  Physical Exam  Nursing note and vitals reviewed. Constitutional: She is oriented to person, place, and time. She appears well-developed and well-nourished. No distress.  HENT:  Head: Normocephalic and atraumatic.  Eyes: Conjunctivae are normal. No scleral icterus.  Neck: Neck supple.  Cardiovascular: Normal rate.   Pulmonary/Chest: Effort normal and breath sounds normal.  Abdominal: Soft.  Bowel sounds are normal. She exhibits no distension. There is no tenderness. There is no rebound and no guarding.  Musculoskeletal: She exhibits no edema and no tenderness.  Neurological: She is alert and oriented to person, place, and time.  Skin: Skin is warm and dry. She is not diaphoretic.  Psychiatric: She has a normal mood and affect.    ED Course  Procedures (including critical care time)  Labs Reviewed  CBC - Abnormal; Notable for the following:    RBC 5.18 (*)    Hemoglobin 16.0 (*)    All other components within normal limits  COMPREHENSIVE METABOLIC PANEL - Abnormal; Notable for the following:    Potassium 3.1 (*)    Total Bilirubin 1.4 (*)    GFR calc non Af Amer 87 (*)    All other components within normal limits  URINALYSIS, ROUTINE W REFLEX MICROSCOPIC - Abnormal; Notable for the following:    Color, Urine ORANGE (*) BIOCHEMICALS MAY BE AFFECTED BY COLOR   APPearance CLOUDY (*)    Nitrite POSITIVE (*)    Leukocytes, UA SMALL (*)    All other components within normal limits  URINE MICROSCOPIC-ADD ON - Abnormal; Notable for the following:    Bacteria, UA FEW (*)    All other components within normal limits  DIFFERENTIAL  CLOSTRIDIUM DIFFICILE BY PCR   Ct Abdomen Pelvis W Contrast  11/02/2011  *RADIOLOGY REPORT*  Clinical Data: Suprapubic pain.  CT ABDOMEN AND PELVIS WITH CONTRAST  Technique:  Multidetector CT imaging of the abdomen and pelvis was performed following the standard protocol during bolus administration of intravenous contrast.  Contrast: OMNIPAQUE IOHEXOL 300 MG/ML IV SOLN  Comparison: 06/28/2008  Findings: Visualized lung bases clear.  Unremarkable liver, nondilated gallbladder, spleen, adrenal glands, kidneys, pancreas. Mild scattered aortoiliac calcified plaque.  No aneurysm.  Stomach and small bowel are nondilated.  Appendix not visualized.  The colon is nondistended, unremarkable.  Urinary bladder incompletely distended.  Previous hysterectomy.   No ascites.  No free air. No evidence of abscess.  No pelvic, retroperitoneal, or mesenteric adenopathy.  Portal vein patent.  Normal bilateral renal excretion. Degenerative disc disease L5-S1.  IMPRESSION:  1.  No acute abdominal process. 2.  Postoperative and mild degenerative changes as above.  Original Report Authenticated By: Thora Lance III, M.D.     1. Diarrhea       MDM   Patient reports that she does not require any analgesic treatment at this moment. Will obtain a CT scan and obtain stool sample for C Diff due to continuation of diarrhea.  Pt does not appear toxic in any way.       10:56 AM Pt remains pain free, CT shows no acute abn's per radiologist.  Pt is reassured.  Will discharge to home and pt can follow up with Dr.  Avva next week as needed.    Gavin Pound. Issam Carlyon, MD 11/02/11 1057

## 2011-11-02 NOTE — ED Notes (Signed)
Pt finished drinking CT contrast, CT tech notified

## 2011-11-02 NOTE — ED Notes (Signed)
C/O suprapubic heaviness and pain, s/b gynecologist on Feb 8 and diagnosed with UTI and yeast infection, given antibiotics but reports continued symptoms, s/b [facility] Feb 13 and diagnosed with UTI, given IV Rocephin, but returns today reporting continued symptoms, also reports loose stools since yesterday

## 2011-11-02 NOTE — Discharge Instructions (Signed)
Diarrhea Infections caused by germs (bacterial) or a virus commonly cause diarrhea. Your caregiver has determined that with time, rest and fluids, the diarrhea should improve. In general, eat normally while drinking more water than usual. Although water may prevent dehydration, it does not contain salt and minerals (electrolytes). Broths, weak tea without caffeine and oral rehydration solutions (ORS) replace fluids and electrolytes. Small amounts of fluids should be taken frequently. Large amounts at one time may not be tolerated. Plain water may be harmful in infants and the elderly. Oral rehydrating solutions (ORS) are available at pharmacies and grocery stores. ORS replace water and important electrolytes in proper proportions. Sports drinks are not as effective as ORS and may be harmful due to sugars worsening diarrhea.  After correction of dehydration, other liquids that are appealing to the child may be added. Children should drink small amounts of fluids frequently and fluids should be increased as tolerated. Children should drink enough fluids to keep urine clear or pale yellow.   Adults should eat normally while drinking more fluids than usual. Drink small amounts of fluids frequently and increase as tolerated. Drink enough fluids to keep urine clear or pale yellow. Broths, weak decaffeinated tea, lemon lime soft drinks (allowed to go flat) and ORS replace fluids and electrolytes.   Avoid:   Carbonated drinks.   Juice.   Extremely hot or cold fluids.   Caffeine drinks.   Fatty, greasy foods.   Alcohol.   Tobacco.   Too much intake of anything at one time.   Gelatin desserts.   Probiotics are active cultures of beneficial bacteria. They may lessen the amount and number of diarrheal stools in adults. Probiotics can be found in yogurt with active cultures and in supplements.   Wash hands well to avoid spreading bacteria and virus.   Anti-diarrheal medications are not recommended  for infants and children.   Only take over-the-counter or prescription medicines for pain, discomfort or fever as directed by your caregiver.   For adults, ask your caregiver if you should continue all prescribed and over-the-counter medicines.   If your caregiver has given you a follow-up appointment, it is very important to keep that appointment. Not keeping the appointment could result in a chronic or permanent injury, and disability. If there is any problem keeping the appointment, you must call back to this facility for assistance.  SEEK IMMEDIATE MEDICAL CARE IF:   You or your child is unable to keep fluids down or other symptoms or problems become worse in spite of treatment.   Vomiting or diarrhea develops and becomes persistent.   There is vomiting of blood or bile (green material).   There is blood in the stool or the stools are black and tarry.   There is no urine output in 6-8 hours or there is only a small amount of very dark urine.   Abdominal pain develops, increases or localizes.   You have a fever.   You or your child develops excessive weakness, dizziness, fainting or extreme thirst.   You or your child develops a rash, stiff neck, severe headache or become irritable or sleepy and difficult to awaken.  MAKE SURE YOU:   Understand these instructions.   Will watch your condition.   Will get help right away if you are not doing well or get worse.  Document Released: 08/23/2002 Document Revised: 05/15/2011 Document Reviewed: 07/10/2009 [facility] Patient Information 2012 [place], [place].

## 2012-01-08 ENCOUNTER — Emergency Department (HOSPITAL_COMMUNITY)
Admission: EM | Admit: 2012-01-08 | Discharge: 2012-01-08 | Disposition: A | Discharge status: Home / Self Care | Payer: Medicare Other | Attending: Emergency Medicine | Admitting: Emergency Medicine

## 2012-01-08 ENCOUNTER — Encounter (HOSPITAL_COMMUNITY): Payer: Self-pay | Admitting: *Deleted

## 2012-01-08 DIAGNOSIS — R109 Unspecified abdominal pain: Secondary | ICD-10-CM | POA: Insufficient documentation

## 2012-01-08 DIAGNOSIS — R197 Diarrhea, unspecified: Secondary | ICD-10-CM | POA: Insufficient documentation

## 2012-01-08 DIAGNOSIS — R110 Nausea: Secondary | ICD-10-CM | POA: Insufficient documentation

## 2012-01-08 HISTORY — DX: Enterocolitis due to Clostridium difficile, not specified as recurrent: A04.72

## 2012-01-08 LAB — URINALYSIS, ROUTINE W REFLEX MICROSCOPIC
Bilirubin Urine: NEGATIVE
Ketones, ur: 15 mg/dL — AB
Leukocytes, UA: NEGATIVE
Nitrite: NEGATIVE
Protein, ur: NEGATIVE mg/dL
Urobilinogen, UA: 0.2 mg/dL (ref 0.0–1.0)

## 2012-01-08 LAB — POCT I-STAT, CHEM 8
Calcium, Ion: 1.19 mmol/L (ref 1.12–1.32)
Chloride: 104 mEq/L (ref 96–112)
Glucose, Bld: 104 mg/dL — ABNORMAL HIGH (ref 70–99)
HCT: 47 % — ABNORMAL HIGH (ref 36.0–46.0)
Hemoglobin: 16 g/dL — ABNORMAL HIGH (ref 12.0–15.0)
TCO2: 30 mmol/L (ref 0–100)

## 2012-01-08 LAB — OCCULT BLOOD, POC DEVICE: Fecal Occult Bld: NEGATIVE

## 2012-01-08 LAB — CLOSTRIDIUM DIFFICILE BY PCR: Toxigenic C. Difficile by PCR: POSITIVE — AB

## 2012-01-08 MED ORDER — METRONIDAZOLE 500 MG PO TABS: 500.0000 mg | ORAL_TABLET | Freq: Three times a day (TID) | ORAL | Status: DC

## 2012-01-08 MED ORDER — SODIUM CHLORIDE 0.9 % IV BOLUS (SEPSIS)
1000.0000 mL | Freq: Once | INTRAVENOUS | Status: AC
  Administered 2012-01-08: 1000 mL via INTRAVENOUS

## 2012-01-08 MED ORDER — METRONIDAZOLE 500 MG PO TABS: 500.0000 mg | ORAL_TABLET | Freq: Three times a day (TID) | ORAL | Status: AC

## 2012-01-08 MED ORDER — ONDANSETRON HCL 8 MG PO TABS: 8.0000 mg | ORAL_TABLET | Freq: Three times a day (TID) | ORAL | Status: AC | PRN

## 2012-01-08 MED ORDER — ONDANSETRON HCL 8 MG PO TABS: 8.0000 mg | ORAL_TABLET | Freq: Three times a day (TID) | ORAL | Status: DC | PRN

## 2012-01-08 NOTE — ED Notes (Signed)
[facility] and stated can fill special medication for patient.  Patient verbalized understanding. Also states before discharge want to try give a stool sample.

## 2012-01-08 NOTE — ED Provider Notes (Signed)
History     CSN: [id number]  Arrival date & time 01/08/12  1017   First MD Initiated Contact with Patient 01/08/12 1036      Chief Complaint  Patient presents with  . Diarrhea  . Abdominal Pain    (Consider location/radiation/quality/duration/timing/severity/associated sxs/prior treatment) HPI History provided by pt.   Pt recent treated for c.diff which was likely secondary to use of cipro.  Completed her course of flagyl and probiotic approx one week ago and symptoms had improved.  At 12:30am today she developed mild, crampy lower abdominal pain and several episodes of diarrhea.  Associated w/ nausea.  Denies fever, urinary sx and hematemesis/hematochezia/melena.  Has been tolerating po fluids.     Past Medical History  Diagnosis Date  . Clostridium difficile diarrhea     Past Surgical History  Procedure Date  . Abdominal hysterectomy   . Appendectomy   . Tubal ligation     No family history on file.  History  Substance Use Topics  . Smoking status: Never Smoker   . Smokeless tobacco: Not on file  . Alcohol Use: No    OB History    Grav Para Term Preterm Abortions TAB SAB Ect Mult Living                  Review of Systems  All other systems reviewed and are negative.    Allergies  Ciprofloxacin and Darvon  Home Medications   Current Outpatient Rx  Name Route Sig Dispense Refill  . BUSPIRONE HCL 7.5 MG PO TABS Oral Take 7.5 mg by mouth 2 (two) times daily.    Marland Kitchen VITAMIN D 1000 UNITS PO TABS Oral Take 1,000 Units by mouth daily.    Marland Kitchen ESTRADIOL 0.1 MG/GM VA CREA Vaginal Place 2 g vaginally daily.    . OMEGA-3 FATTY ACIDS 1000 MG PO CAPS Oral Take 1 g by mouth daily.    Marland Kitchen LORATADINE 10 MG PO TABS Oral Take 10 mg by mouth daily.    Marland Kitchen METRONIDAZOLE 250 MG PO TABS Oral Take 250 mg by mouth 4 (four) times daily.    . MULTI-VITAMIN/MINERALS PO TABS Oral Take 1 tablet by mouth daily.    Marland Kitchen SACCHAROMYCES BOULARDII 250 MG PO CAPS Oral Take 250 mg by mouth 2 (two)  times daily.      BP 120/65  Pulse 86  Temp(Src) 98.1 F (36.7 C) (Oral)  Resp 16  Ht 5\' 3"  (1.6 m)  Wt 126 lb (57.153 kg)  BMI 22.32 kg/m2  SpO2 98%  Physical Exam  Nursing note and vitals reviewed. Constitutional: She is oriented to person, place, and time. She appears well-developed and well-nourished. No distress.  HENT:  Head: Normocephalic and atraumatic.  Mouth/Throat: Oropharynx is clear and moist.  Eyes:       Normal appearance  Neck: Normal range of motion.  Cardiovascular: Normal rate and regular rhythm.   Pulmonary/Chest: Effort normal and breath sounds normal. No respiratory distress.  Abdominal: Soft. Bowel sounds are normal. She exhibits no distension and no mass. There is no rebound and no guarding.       Mild epigastric ttp  Genitourinary:       No stool or blood in rectum.  Mild tenderness.    Musculoskeletal: Normal range of motion.  Neurological: She is alert and oriented to person, place, and time.  Skin: Skin is warm and dry. No rash noted.  Psychiatric: She has a normal mood and affect. Her behavior is normal.  ED Course  Procedures (including critical care time)  Labs Reviewed  URINALYSIS, ROUTINE W REFLEX MICROSCOPIC - Abnormal; Notable for the following:    Ketones, ur 15 (*)    All other components within normal limits  POCT I-STAT, CHEM 8 - Abnormal; Notable for the following:    Glucose, Bld 104 (*)    Hemoglobin 16.0 (*)    HCT 47.0 (*)    All other components within normal limits  OCCULT BLOOD, POC DEVICE  CLOSTRIDIUM DIFFICILE BY PCR   No results found.   1. Diarrhea       MDM  Pt recently treated for c.diff and the same symptoms have returned after one week of completing flagyl.  C/o diarrhea, mild lower abd cramping and nausea.   On exam, well-hydrated, mild epigastric ttp, nml rectum.  No electrolyte abnormalities and hemoccult neg.  C.diff stool culture pending.   Pt received a liter bolus of fluids, VSS and has only had  one episode of diarrhea in ED. Dr. Radford Pax consulted Dr. Bosie Clos and he recommends repeating course of flagyl +/- liquid vanc if patient can afford and is agreeable.  PT would like like to start the vanc.  She is concerned because the flagyl made her feel depressed.  This is a possible SE of the drug and I recommended that pt d/c immediately and return to the ER if she develops SI.  Flagyl also made her nauseous; I prescribed zofran.  Recommended f/u with GI.  Return precautions discussed.         Otilio Miu, [place] 01/08/12 1620

## 2012-01-08 NOTE — ED Notes (Signed)
Patient with recent inpatient stay for cdiff.  She is concerned that she has return of cdiff.  She is having diarrhea since yesterday and she has abd pain.

## 2012-01-08 NOTE — ED Notes (Signed)
Pt. D/C home. Prescriptions and belongings in hand. A. O. X 4. Denies pain.

## 2012-01-08 NOTE — Discharge Instructions (Signed)
Take antibiotics as prescribed.  Drink plenty of fluids to prevent dehydration.  If the medications cause suicidal thoughts, please discontinue immediately and return to the ER.  Follow up with your GI physician.   You may return to the ER if symptoms worsen or you have any other concerns.   Clostridium Difficile Toxin This is a test which may be done when a patient has diarrhea that lasts for several days, or has abdominal pain, fever, and nausea after antibiotic therapy.  This test looks for the presence of Clostridium difficile (C.diff.) toxin in a stool sample. C.diff. is a germ (bacterium) that is one of the groups of bacteria that are usually in the colon, called "normal flora." If something upsets the growth of the other normal flora, C.diff. may overgrow and disrupt the balance of bacteria in the colon. C. diff. may produce two toxins, A and B. The combination of overgrowth and toxins may cause prolonged diarrhea. The toxins may damage the lining of the colon and lead to colitis.  While some cases of C. diff. diarrhea and colitis do not require treatment, others require specific oral antibiotic therapy. Most patients improve as the normal flora re-establishes itself, but about some may have one or more relapses, with symptoms and detectible toxin levels coming back. PREPARATION FOR TEST There is no special preparation for the test. A fresh stool sample is collected in a sterile container. The sample should not be mixed with urine or water. The stool should be taken to the lab within an hour. It may be refrigerated or frozen and taken to the lab as soon as possible. The container should be labeled with your name and the date and time of the stool collection.  NORMAL FINDINGS Negative Tissue Culture (no toxin identified) Ranges for normal findings may vary among different laboratories and hospitals. You should always check with your doctor after having lab work or other tests done to discuss the  meaning of your test results and whether your values are considered within normal limits. MEANING OF TEST  Your caregiver will go over the test results with you and discuss the importance and meaning of your results, as well as treatment options and the need for additional tests if necessary. OBTAINING THE TEST RESULTS It is your responsibility to obtain your test results. Ask the lab or department performing the test when and how you will get your results. Document Released: 09/25/2004 Document Revised: 08/22/2011 Document Reviewed: 08/11/2008 [facility] Patient Information 2012 [place], [place].

## 2012-01-08 NOTE — ED Provider Notes (Signed)
Medical screening examination/treatment/procedure(s) were performed by non-physician practitioner and as supervising physician I was immediately available for consultation/collaboration.    Roshni Burbano L Gracen Ringwald, MD 01/08/12 2013 

## 2012-06-02 ENCOUNTER — Other Ambulatory Visit: Payer: Self-pay | Admitting: Obstetrics & Gynecology

## 2012-06-02 DIAGNOSIS — Z1231 Encounter for screening mammogram for malignant neoplasm of breast: Secondary | ICD-10-CM

## 2012-06-24 ENCOUNTER — Ambulatory Visit
Admission: RE | Admit: 2012-06-24 | Discharge: 2012-06-24 | Disposition: A | Discharge status: Home / Self Care | Payer: Medicare Other | Source: Ambulatory Visit | Attending: Obstetrics & Gynecology | Admitting: Obstetrics & Gynecology

## 2012-06-24 DIAGNOSIS — Z1231 Encounter for screening mammogram for malignant neoplasm of breast: Secondary | ICD-10-CM

## 2013-02-04 ENCOUNTER — Encounter: Payer: Self-pay | Admitting: Obstetrics & Gynecology

## 2013-03-09 ENCOUNTER — Ambulatory Visit: Payer: Self-pay | Admitting: Obstetrics & Gynecology

## 2013-03-15 ENCOUNTER — Other Ambulatory Visit: Payer: Self-pay | Admitting: *Deleted

## 2013-03-15 MED ORDER — ESTRADIOL 0.1 MG/GM VA CREA: 2.0000 g | TOPICAL_CREAM | Freq: Every day | VAGINAL | Status: DC

## 2013-03-15 NOTE — Telephone Encounter (Signed)
Fax from [facility] for Estrace 0.01% Cream  Last Refilled 01/06/12 AEX scheduled for 04/22/13  #42.5 gm/1 refill sent through to last pt. Until AEX

## 2013-04-22 ENCOUNTER — Ambulatory Visit (INDEPENDENT_AMBULATORY_CARE_PROVIDER_SITE_OTHER): Payer: Medicare Other | Admitting: Obstetrics & Gynecology

## 2013-04-22 ENCOUNTER — Encounter: Payer: Self-pay | Admitting: Obstetrics & Gynecology

## 2013-04-22 VITALS — BP 132/78 | HR 60 | Resp 16 | Ht 62.25 in | Wt 141.0 lb

## 2013-04-22 DIAGNOSIS — Z01419 Encounter for gynecological examination (general) (routine) without abnormal findings: Secondary | ICD-10-CM

## 2013-04-22 NOTE — Progress Notes (Signed)
72 y.o. G3P3 MarriedCaucasianF here for annual exam.  No vaginal bleeding.  Finally got over the C. Diff.  Saw Dr. Loreta Ave for this and has been "released".  Going to start Prolia every six months and then will have follow-up BMD.  She is walking regularly.  Also increased calcium intake.  Saw Dr. Felipa Eth in May.  Having lots of trouble with grown daughter who will not leave home.   Patient's last menstrual period was 09/16/1993.          Sexually active: yes  The current method of family planning is tubal ligation and status post hysterectomy.    Exercising: yes  walking and weight lifting Smoker:  no  Health Maintenance: Pap:  02/13/12 WNL History of abnormal Pap:  no MMG:  06/24/12 3D-normal Colonoscopy:  2009 repeat in 10 years, Dr. Matthias Hughs BMD:   2/14 -2.5/-2.5 TDaP:  Up to date with Dr Felipa Eth Screening Labs: PCP, Hb today: PCP, Urine today: PCP   reports that she has never smoked. She has never used smokeless tobacco. She reports that she does not drink alcohol or use illicit drugs.  Past Medical History  Diagnosis Date  . Clostridium difficile diarrhea   . Osteopenia     may be starting the prolia injection/in between osteopenia and osteoporosis    Past Surgical History  Procedure Laterality Date  . Abdominal hysterectomy    . Appendectomy    . Tubal ligation      Current Outpatient Prescriptions  Medication Sig Dispense Refill  . busPIRone (BUSPAR) 7.5 MG tablet Take 7.5 mg by mouth 2 (two) times daily.      . Calcium-Vitamin D-Vitamin K (VIACTIV PO) Take 1,000 mg by mouth daily.      . cholecalciferol (VITAMIN D) 1000 UNITS tablet Take 1,000 Units by mouth daily.      Marland Kitchen estradiol (ESTRACE) 0.1 MG/GM vaginal cream Place 0.25 Applicatorfuls vaginally daily.  42.5 g  1  . fish oil-omega-3 fatty acids 1000 MG capsule Take 1 g by mouth daily.      . Multiple Vitamins-Minerals (MULTIVITAMIN WITH MINERALS) tablet Take 1 tablet by mouth daily.      Marland Kitchen saccharomyces boulardii  (FLORASTOR) 250 MG capsule Take 250 mg by mouth 2 (two) times daily.      Marland Kitchen loratadine (CLARITIN) 10 MG tablet Take 10 mg by mouth daily.      Marland Kitchen omeprazole (PRILOSEC) 20 MG capsule as needed.       No current facility-administered medications for this visit.    Family History  Problem Relation Age of Onset  . Breast cancer Maternal Aunt 60  . Heart disease Maternal Grandmother   . Heart attack Paternal Grandmother   . Thyroid disease Mother   . Heart attack Brother     ROS:  Pertinent items are noted in HPI.  Otherwise, a comprehensive ROS was negative.  Exam:   BP 132/78  Pulse 60  Resp 16  Ht 5' 2.25" (1.581 m)  Wt 141 lb (63.957 kg)  BMI 25.59 kg/m2  LMP 09/16/1993  Weight change: +18lb  Height: 5' 2.25" (158.1 cm)  Ht Readings from Last 3 Encounters:  04/22/13 5' 2.25" (1.581 m)  01/08/12 5\' 3"  (1.6 m)  11/02/11 5\' 3"  (1.6 m)    General appearance: alert, cooperative and appears stated age Head: Normocephalic, without obvious abnormality, atraumatic Neck: no adenopathy, supple, symmetrical, trachea midline and thyroid normal to inspection and palpation Lungs: clear to auscultation bilaterally Breasts: normal appearance, no  masses or tenderness Heart: regular rate and rhythm Abdomen: soft, non-tender; bowel sounds normal; no masses,  no organomegaly Extremities: extremities normal, atraumatic, no cyanosis or edema Skin: Skin color, texture, turgor normal. No rashes or lesions Lymph nodes: Cervical, supraclavicular, and axillary nodes normal. No abnormal inguinal nodes palpated Neurologic: Grossly normal   Pelvic: External genitalia:  no lesions              Urethra:  normal appearing urethra with no masses, tenderness or lesions              Bartholins and Skenes: normal                 Vagina: normal appearing vagina with normal color and discharge, no lesions              Cervix: absent              Pap taken: no Bimanual Exam:  Uterus:  uterus absent               Adnexa: no mass, fullness, tenderness               Rectovaginal: Confirms               Anus:  normal sphincter tone, no lesions  A:  Well Woman with normal exam H/O C. Diff S/P TAH/BSO Osteoporosis  P:   Mammogram yearly pap smear last year Will need RF for Estrace cream prn Labs with Dr. Felipa Eth return annually or prn  An After Visit Summary was printed and given to the patient.

## 2013-04-22 NOTE — Patient Instructions (Signed)

## 2013-06-04 ENCOUNTER — Other Ambulatory Visit: Payer: Self-pay

## 2013-06-04 DIAGNOSIS — Z1231 Encounter for screening mammogram for malignant neoplasm of breast: Secondary | ICD-10-CM

## 2013-06-29 ENCOUNTER — Ambulatory Visit
Admission: RE | Admit: 2013-06-29 | Discharge: 2013-06-29 | Disposition: A | Discharge status: Home / Self Care | Payer: Medicare Other | Source: Ambulatory Visit

## 2013-06-29 DIAGNOSIS — Z1231 Encounter for screening mammogram for malignant neoplasm of breast: Secondary | ICD-10-CM

## 2013-07-07 ENCOUNTER — Other Ambulatory Visit: Payer: Self-pay | Admitting: Obstetrics & Gynecology

## 2013-07-07 NOTE — Telephone Encounter (Signed)
eScribe request for refill on TERAZOLE Last filled - 02/13/12 Last AEX - 04/22/13 Next AEX - 07/12/14  Message left to return call.  Pt needs appt for refills.

## 2013-07-09 ENCOUNTER — Telehealth: Payer: Self-pay | Admitting: Obstetrics & Gynecology

## 2013-07-09 ENCOUNTER — Ambulatory Visit (INDEPENDENT_AMBULATORY_CARE_PROVIDER_SITE_OTHER): Payer: Medicare Other | Admitting: Obstetrics & Gynecology

## 2013-07-09 ENCOUNTER — Encounter: Payer: Self-pay | Admitting: Obstetrics & Gynecology

## 2013-07-09 VITALS — BP 122/70 | HR 74 | Resp 16 | Wt 140.0 lb

## 2013-07-09 DIAGNOSIS — N949 Unspecified condition associated with female genital organs and menstrual cycle: Secondary | ICD-10-CM

## 2013-07-09 DIAGNOSIS — R82998 Other abnormal findings in urine: Secondary | ICD-10-CM

## 2013-07-09 LAB — POCT URINALYSIS DIPSTICK
Blood, UA: NEGATIVE
Glucose, UA: NEGATIVE
Urobilinogen, UA: NEGATIVE
pH, UA: 5

## 2013-07-09 MED ORDER — NYSTATIN-TRIAMCINOLONE 100000-0.1 UNIT/GM-% EX OINT: TOPICAL_OINTMENT | Freq: Three times a day (TID) | CUTANEOUS | Status: DC

## 2013-07-09 NOTE — Telephone Encounter (Signed)
Spoke with pt who will take the appt this afternoon with SM. Offered appt at 3:30 with PG if pt prefers to have more time to get here. Pt would really like to see SM at 2:30.

## 2013-07-09 NOTE — Patient Instructions (Signed)
Please call with update on Monday.

## 2013-07-09 NOTE — Telephone Encounter (Signed)
Pt needs appt.  Pt did not return call.  RX denied. Encounter closed.

## 2013-07-09 NOTE — Telephone Encounter (Signed)
Patient is returning Sylvia Kennedy call she said she wanted to try to come in today. Late afternoon.

## 2013-07-09 NOTE — Progress Notes (Signed)
Subjective:     Patient ID: Sylvia Kennedy, female   DOB: [birth year], 72 y.o.   MRN: [id number]  HPI 72 yo G3P3 here for several day h/o vulvar/vaginal irritation.  Had some vaginal odor and used some left over Terazol from 1 1/2 year ago.  Had no discharge.  No VB.  Denies dysuria/frequency/hemturia.  No fevers.    Review of Systems  All other systems reviewed and are negative.       Objective:   Physical Exam  Constitutional: She appears well-developed and well-nourished.  Genitourinary:    There is no rash on the right labia. There is no rash or tenderness on the left labia. No bleeding around the vagina. No vaginal discharge found.  Lymphadenopathy:       Right: No inguinal adenopathy present.       Left: No inguinal adenopathy present.      u/a results reported as trace leuk but is 2+  Wet smear:  Ph 5.0, saline with +WBCs, KOH no yeast, no whitt Assessment:     Vulvar yeast and topical vulvar irritation possibly from old Terazol    Plan:        Mycolog II externall 2-3 times daily for up to a week Urine culture pending just to be sure no infection

## 2013-07-09 NOTE — Telephone Encounter (Signed)
Spoke with pt who is having problems with yeast. Inquired about a refill of Terazol, but was informed that the prescription had run out. Pt says she usually sees SM. Offered OV today with SM at 2:30. Pt said she would check and call me back.

## 2013-07-11 LAB — URINE CULTURE

## 2013-07-13 MED ORDER — CLOBETASOL PROPIONATE 0.05 % EX OINT: TOPICAL_OINTMENT | Freq: Two times a day (BID) | CUTANEOUS | Status: DC

## 2013-07-13 NOTE — Addendum Note (Signed)
Addended by: Jerene Bears on: 07/13/2013 05:55 PM   Modules accepted: Orders

## 2014-01-13 ENCOUNTER — Other Ambulatory Visit: Payer: Self-pay | Admitting: Gastroenterology

## 2014-01-13 DIAGNOSIS — R1011 Right upper quadrant pain: Secondary | ICD-10-CM

## 2014-01-27 ENCOUNTER — Ambulatory Visit (HOSPITAL_COMMUNITY)
Admission: RE | Admit: 2014-01-27 | Discharge: 2014-01-27 | Disposition: A | Discharge status: Home / Self Care | Payer: Medicare Other | Source: Ambulatory Visit | Attending: Gastroenterology | Admitting: Gastroenterology

## 2014-01-27 DIAGNOSIS — R1011 Right upper quadrant pain: Secondary | ICD-10-CM

## 2014-02-14 ENCOUNTER — Encounter: Payer: Self-pay | Admitting: Neurology

## 2014-02-14 ENCOUNTER — Ambulatory Visit (INDEPENDENT_AMBULATORY_CARE_PROVIDER_SITE_OTHER): Payer: Medicare Other | Admitting: Neurology

## 2014-02-14 VITALS — BP 131/71 | HR 63 | Ht 63.0 in | Wt 142.0 lb

## 2014-02-14 DIAGNOSIS — M542 Cervicalgia: Secondary | ICD-10-CM
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
## 2014-02-17 ENCOUNTER — Encounter (HOSPITAL_COMMUNITY)
Admission: RE | Admit: 2014-02-17 | Discharge: 2014-02-17 | Disposition: A | Discharge status: Home / Self Care | Payer: Medicare Other | Source: Ambulatory Visit | Attending: Gastroenterology | Admitting: Gastroenterology

## 2014-02-17 DIAGNOSIS — R109 Unspecified abdominal pain: Secondary | ICD-10-CM | POA: Insufficient documentation

## 2014-02-17 MED ORDER — TECHNETIUM TC 99M MEBROFENIN IV KIT
5.1000 | PACK | Freq: Once | INTRAVENOUS | Status: AC | PRN
  Administered 2014-02-17: 5 via INTRAVENOUS

## 2014-02-26 ENCOUNTER — Encounter (HOSPITAL_COMMUNITY): Payer: Self-pay | Admitting: Emergency Medicine

## 2014-02-26 ENCOUNTER — Emergency Department (HOSPITAL_COMMUNITY)
Admission: EM | Admit: 2014-02-26 | Discharge: 2014-02-26 | Disposition: A | Discharge status: Home / Self Care | Payer: Medicare Other | Attending: Emergency Medicine | Admitting: Emergency Medicine

## 2014-02-26 DIAGNOSIS — Z79899 Other long term (current) drug therapy: Secondary | ICD-10-CM | POA: Insufficient documentation

## 2014-02-26 DIAGNOSIS — M546 Pain in thoracic spine: Secondary | ICD-10-CM | POA: Insufficient documentation

## 2014-02-26 DIAGNOSIS — Z8619 Personal history of other infectious and parasitic diseases: Secondary | ICD-10-CM | POA: Insufficient documentation

## 2014-02-26 DIAGNOSIS — M25519 Pain in unspecified shoulder: Secondary | ICD-10-CM | POA: Insufficient documentation

## 2014-02-26 DIAGNOSIS — M549 Dorsalgia, unspecified: Secondary | ICD-10-CM

## 2014-02-26 MED ORDER — METHOCARBAMOL 500 MG PO TABS: 500.0000 mg | ORAL_TABLET | Freq: Two times a day (BID) | ORAL | Status: DC

## 2014-02-26 NOTE — ED Notes (Signed)
Pt states that for over a month she will have intermittent upper back/shoulder pain and get the feeling she is choking. Pt has seen her Dr. Dagmar Hait about this as well as an neurologist.  Pt states that her brothers have both had a MI before.

## 2014-02-26 NOTE — ED Notes (Signed)
Pt escorted to discharge window. Verbalized understanding discharge instructions. In no acute distress.   

## 2014-02-26 NOTE — ED Provider Notes (Signed)
CSN: [id number]     Arrival date & time 02/26/14  0753 History   First MD Initiated Contact with Patient 02/26/14 [phone]     Chief Complaint  Patient presents with  . Back Pain  . Shoulder Pain     (Consider location/radiation/quality/duration/timing/severity/associated sxs/prior Treatment) Patient is a 73 y.o. female presenting with back pain and shoulder pain. The history is provided by the patient and the spouse.  Back Pain Shoulder Pain   patient here with 1.5 months of upper mid thoracic back pain which wakes her from sleep. She describes radiation to both extremities as electrical shocks. Denies any associated chest pain or shortness of breath. She also notes that she has a choking sensation and dry throat. Hasn't seen by her Dr. for this and referred to various subspecialists. She has been seen by neurology and they feel that this might be a thoracic nerve or cervical nerve issue as scheduled outpatient testing. She has been seen by gastroenterology and had gallbladder function studies done which showed that her gallbladder functions at 66%. This morning when her symptoms recurred she took Mylanta and felt somewhat better. She denies any syncope or near-syncope. She is currently asymptomatic.  Past Medical History  Diagnosis Date  . Clostridium difficile diarrhea   . Osteopenia     may be starting the prolia injection/in between osteopenia and osteoporosis   Past Surgical History  Procedure Laterality Date  . Abdominal hysterectomy    . Appendectomy    . Tubal ligation     Family History  Problem Relation Age of Onset  . Breast cancer Maternal Aunt 60  . Heart disease Maternal Grandmother   . Heart attack Paternal Grandmother   . Thyroid disease Mother   . Heart attack Brother    History  Substance Use Topics  . Smoking status: Never Smoker   . Smokeless tobacco: Never Used  . Alcohol Use: No   OB History   Grav Para Term Preterm Abortions TAB SAB Ect Mult Living   3  3        3      Review of Systems  Musculoskeletal: Positive for back pain.  All other systems reviewed and are negative.     Allergies  Ciprofloxacin; Darvon; and Mtx support  Home Medications   Prior to Admission medications   Medication Sig Start Date End Date Taking? Authorizing Provider  busPIRone (BUSPAR) 7.5 MG tablet Take 7.5 mg by mouth 2 (two) times daily.    Historical Provider, MD  Calcium-Vitamin D-Vitamin K (VIACTIV PO) Take 1,000 mg by mouth daily.    Historical Provider, MD  cholecalciferol (VITAMIN D) 1000 UNITS tablet Take 1,000 Units by mouth daily.    Historical Provider, MD  estradiol (ESTRACE) 0.1 MG/GM vaginal cream Place 4.19 Applicatorfuls vaginally daily. 03/15/13   Lyman Speller, MD  fish oil-omega-3 fatty acids 1000 MG capsule Take 1 g by mouth daily.    Historical Provider, MD  loratadine (CLARITIN) 10 MG tablet Take 10 mg by mouth daily.    Historical Provider, MD  Multiple Vitamins-Minerals (MULTIVITAMIN WITH MINERALS) tablet Take 1 tablet by mouth daily.    Historical Provider, MD  omeprazole (PRILOSEC) 20 MG capsule as needed. 02/25/13   Historical Provider, MD   BP 145/76  Pulse 81  Temp(Src) 98.4 F (36.9 C) (Oral)  Resp 17  SpO2 98%  LMP 09/16/1993 Physical Exam  Nursing note and vitals reviewed. Constitutional: She is oriented to person, place, and time. She appears  well-developed and well-nourished.  Non-toxic appearance. No distress.  HENT:  Head: Normocephalic and atraumatic.  Eyes: Conjunctivae, EOM and lids are normal. Pupils are equal, round, and reactive to light.  Neck: Normal range of motion. Neck supple. No tracheal deviation present. No mass present.  Cardiovascular: Normal rate, regular rhythm and normal heart sounds.  Exam reveals no gallop.   No murmur heard. Pulmonary/Chest: Effort normal and breath sounds normal. No stridor. No respiratory distress. She has no decreased breath sounds. She has no wheezes. She has no  rhonchi. She has no rales.  Abdominal: Soft. Normal appearance and bowel sounds are normal. She exhibits no distension. There is no tenderness. There is no rebound and no CVA tenderness.  Musculoskeletal: Normal range of motion. She exhibits no edema and no tenderness.       Arms: Neurological: She is alert and oriented to person, place, and time. She has normal strength. No cranial nerve deficit or sensory deficit. GCS eye subscore is 4. GCS verbal subscore is 5. GCS motor subscore is 6.  Skin: Skin is warm and dry. No abrasion and no rash noted.  Psychiatric: She has a normal mood and affect. Her speech is normal and behavior is normal.    ED Course  Procedures (including critical care time) Labs Review Labs Reviewed - No data to display  Imaging Review No results found.   EKG Interpretation None      MDM   Final diagnoses:  None     Date: 02/26/2014  Rate: 78  Rhythm: normal sinus rhythm  QRS Axis: normal  Intervals: normal  ST/T Wave abnormalities: nonspecific ST changes  Conduction Disutrbances:left bundle branch block  Narrative Interpretation:   Old EKG Reviewed: none available  Pt has h/o LBBB, no anginal symptoms. Patient hasn't seen by her neurologist and has a scheduled MRI in 1 week. Do not think that her symptoms represent ACS. Neurological exam is normal. Prescribed muscle relaxant and she will followup with her doctor.      Leota Jacobsen, MD 02/26/14 [phone]

## 2014-02-26 NOTE — ED Notes (Signed)
Pt reports upper back pain and choking feeling only around 2 am x 1 month.  Pt, also, sts intermittent "stinging" feeling in all extremities.

## 2014-02-26 NOTE — Discharge Instructions (Signed)
Back Pain, Adult Low back pain is very common. About 1 in 5 people have back pain.The cause of low back pain is rarely dangerous. The pain often gets better over time.About half of people with a sudden onset of back pain feel better in just 2 weeks. About 8 in 10 people feel better by 6 weeks.  CAUSES Some common causes of back pain include:  Strain of the muscles or ligaments supporting the spine.  Wear and tear (degeneration) of the spinal discs.  Arthritis.  Direct injury to the back. DIAGNOSIS Most of the time, the direct cause of low back pain is not known.However, back pain can be treated effectively even when the exact cause of the pain is unknown.Answering your caregiver's questions about your overall health and symptoms is one of the most accurate ways to make sure the cause of your pain is not dangerous. If your caregiver needs more information, he or she may order lab work or imaging tests (X-rays or MRIs).However, even if imaging tests show changes in your back, this usually does not require surgery. HOME CARE INSTRUCTIONS For many people, back pain returns.Since low back pain is rarely dangerous, it is often a condition that people can learn to manageon their own.   Remain active. It is stressful on the back to sit or stand in one place. Do not sit, drive, or stand in one place for more than 30 minutes at a time. Take short walks on level surfaces as soon as pain allows.Try to increase the length of time you walk each day.  Do not stay in bed.Resting more than 1 or 2 days can delay your recovery.  Do not avoid exercise or work.Your body is made to move.It is not dangerous to be active, even though your back may hurt.Your back will likely heal faster if you return to being active before your pain is gone.  Pay attention to your body when you bend and lift. Many people have less discomfortwhen lifting if they bend their knees, keep the load close to their bodies,and  avoid twisting. Often, the most comfortable positions are those that put less stress on your recovering back.  Find a comfortable position to sleep. Use a firm mattress and lie on your side with your knees slightly bent. If you lie on your back, put a pillow under your knees.  Only take over-the-counter or prescription medicines as directed by your caregiver. Over-the-counter medicines to reduce pain and inflammation are often the most helpful.Your caregiver may prescribe muscle relaxant drugs.These medicines help dull your pain so you can more quickly return to your normal activities and healthy exercise.  Put ice on the injured area.  Put ice in a plastic bag.  Place a towel between your skin and the bag.  Leave the ice on for 15-20 minutes, 03-04 times a day for the first 2 to 3 days. After that, ice and heat may be alternated to reduce pain and spasms.  Ask your caregiver about trying back exercises and gentle massage. This may be of some benefit.  Avoid feeling anxious or stressed.Stress increases muscle tension and can worsen back pain.It is important to recognize when you are anxious or stressed and learn ways to manage it.Exercise is a great option. SEEK MEDICAL CARE IF:  You have pain that is not relieved with rest or medicine.  You have pain that does not improve in 1 week.  You have new symptoms.  You are generally not feeling well. SEEK   IMMEDIATE MEDICAL CARE IF:   You have pain that radiates from your back into your legs.  You develop new bowel or bladder control problems.  You have unusual weakness or numbness in your arms or legs.  You develop nausea or vomiting.  You develop abdominal pain.  You feel faint. Document Released: 09/02/2005 Document Revised: 03/03/2012 Document Reviewed: 01/21/2011 [employer] Patient Information 2014 [employer].  

## 2014-02-28 ENCOUNTER — Other Ambulatory Visit: Payer: Self-pay | Admitting: Obstetrics & Gynecology

## 2014-03-01 NOTE — Telephone Encounter (Signed)
Last AEX 04/22/13 Last refill 07/13/13 #30g/0 refills Next appt 07/12/14  Please approve or deny Rx.

## 2014-03-07 ENCOUNTER — Ambulatory Visit
Admission: RE | Admit: 2014-03-07 | Discharge: 2014-03-07 | Disposition: A | Discharge status: Home / Self Care | Payer: Medicare Other | Source: Ambulatory Visit | Attending: Neurology | Admitting: Neurology

## 2014-03-07 ENCOUNTER — Telehealth: Payer: Self-pay | Admitting: Neurology

## 2014-03-07 DIAGNOSIS — M542 Cervicalgia: Secondary | ICD-10-CM

## 2014-03-07 NOTE — Telephone Encounter (Signed)
Sylvia Kennedy, please call patient, MRI cervical showed mild degenerative disc disease, no significant canal stenosis.

## 2014-03-09 NOTE — Telephone Encounter (Signed)
Spoke to patient and relayed MRI cervical results showing mild degenerative disc disease, per Dr. Krista Blue.  Patient wants report to be faxed to Dr. Collene Mares.

## 2014-03-21 ENCOUNTER — Telehealth: Payer: Self-pay | Admitting: Neurology

## 2014-03-21 NOTE — Telephone Encounter (Signed)
Patient requesting an earlier appointment than 8/3 due to condition has worsened.  Please call and advise.

## 2014-03-22 NOTE — Telephone Encounter (Signed)
Called Sylvia Kennedy to schedule an earlier appt with Dr. Krista Blue on 03/25/14. I advised the Sylvia Kennedy that if she has any other problems, questions or concerns to call the office. Sylvia Kennedy verbalized understanding.

## 2014-03-25 ENCOUNTER — Ambulatory Visit (INDEPENDENT_AMBULATORY_CARE_PROVIDER_SITE_OTHER): Payer: Medicare Other | Admitting: Neurology

## 2014-03-25 ENCOUNTER — Encounter (INDEPENDENT_AMBULATORY_CARE_PROVIDER_SITE_OTHER): Payer: Self-pay

## 2014-03-25 ENCOUNTER — Encounter: Payer: Self-pay | Admitting: Neurology

## 2014-03-25 VITALS — BP 122/65 | HR 68 | Ht 63.0 in | Wt 138.0 lb

## 2014-03-25 DIAGNOSIS — R209 Unspecified disturbances of skin sensation: Secondary | ICD-10-CM

## 2014-03-25 DIAGNOSIS — R202 Paresthesia of skin: Secondary | ICD-10-CM | POA: Insufficient documentation
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
## 2014-04-15 ENCOUNTER — Ambulatory Visit (INDEPENDENT_AMBULATORY_CARE_PROVIDER_SITE_OTHER): Payer: Medicare Other

## 2014-04-15 ENCOUNTER — Ambulatory Visit (INDEPENDENT_AMBULATORY_CARE_PROVIDER_SITE_OTHER): Payer: Medicare Other | Admitting: Podiatrist

## 2014-04-15 ENCOUNTER — Encounter: Payer: Self-pay | Admitting: Podiatrist

## 2014-04-15 VITALS — BP 125/64 | HR 58 | Resp 18

## 2014-04-15 DIAGNOSIS — M722 Plantar fascial fibromatosis: Secondary | ICD-10-CM

## 2014-04-15 MED ORDER — TRIAMCINOLONE ACETONIDE 10 MG/ML IJ SUSP
10.0000 mg | Freq: Once | INTRAMUSCULAR | Status: AC
  Administered 2014-04-27: 10 mg

## 2014-04-15 NOTE — Patient Instructions (Signed)

## 2014-04-15 NOTE — Progress Notes (Signed)
   Subjective:    Patient ID: Sylvia Kennedy, female    DOB: Nov 02, 1940, 73 y.o.   MRN: [id number]  HPI left heel has been going on for about a year and it gets better and then it flares up again, throbs and hurts in morning and dosen't hurt with shoes and some swelling    Review of Systems  Constitutional: Positive for activity change.  HENT: Positive for sinus pressure.   Cardiovascular:       Calf pain with walking  All other systems reviewed and are negative.      Objective:   Physical Exam Patient is awake, alert, and oriented x 3.  In no acute distress.  Vascular status is intact with palpable pedal pulses at 2/4 DP and PT bilateral and capillary refill time within normal limits. Neurological sensation is also intact bilaterally via Semmes Weinstein monofilament at 5/5 sites. Light touch, vibratory sensation, Achilles tendon reflex is intact. Dermatological exam reveals skin color, turger and texture as normal. No open lesions present.  Musculature intact with dorsiflexion, plantarflexion, inversion, eversion.    Assessment & Plan:  Plantar fasciitis left   Plan: Injected the left heel with Kenalog and Marcaine mix under sterile technique. Removable plantar fascial taping was applied. We will check on her orthotic coverage and we'll scan her when is convenient. Judging and icing exercises were also discussed.

## 2014-04-18 ENCOUNTER — Ambulatory Visit: Payer: Medicare Other | Admitting: Neurology

## 2014-04-21 NOTE — Telephone Encounter (Signed)
Noted  

## 2014-04-27 ENCOUNTER — Ambulatory Visit (INDEPENDENT_AMBULATORY_CARE_PROVIDER_SITE_OTHER): Payer: Medicare Other | Admitting: Podiatrist

## 2014-04-27 ENCOUNTER — Encounter: Payer: Self-pay | Admitting: Podiatrist

## 2014-04-27 VITALS — BP 130/71 | HR 57 | Resp 18

## 2014-04-27 DIAGNOSIS — M722 Plantar fascial fibromatosis: Secondary | ICD-10-CM

## 2014-04-27 MED ORDER — METHYLPREDNISOLONE (PAK) 4 MG PO TABS: ORAL_TABLET | ORAL | Status: DC

## 2014-04-27 MED ORDER — TRIAMCINOLONE ACETONIDE 10 MG/ML IJ SUSP
10.0000 mg | Freq: Once | INTRAMUSCULAR | Status: AC
Start: 2014-04-27 — End: 2014-04-27
  Administered 2014-04-27: 10 mg

## 2014-04-27 NOTE — Progress Notes (Signed)
Subjective: Patient presents today for followup of left heel pain. She states that the last injection was of no benefit with the exception of a couple days after it was given. She's unable to walk and she's concerned about her heel pain. She continues to have pain with the first step in the morning which improves after a couple of steps.  Objective: Neurovascular status intact and unchanged. Pinpoint pain on palpation plantar medial aspect of the left heel at the insertion of the plantar fascia on the medial calcaneal tubercle is noted. No pain or tingling with tapping along the tarsal canal.  Assessment: Plantar fasciitis left-resistant  Plan: A second injection was carried out today with Kenalog and Marcaine plain under sterile technique. A removable plantar fascial taping was applied. We discussed the positive long-term benefits of orthotics however she would like to hold off at this time. I also wrote her prescription for Medrol Dosepak to take. I will see her back in 3 weeks for followup. If she does not want to consider the custom orthotics may consider a power step at that time.

## 2014-04-27 NOTE — Patient Instructions (Signed)
Wait 2 days to see if the injection is going to work for you-- if it is still painful, take the dose pack that was called into your pharmacy.

## 2014-05-25 ENCOUNTER — Encounter: Payer: Self-pay | Admitting: Podiatrist

## 2014-05-25 ENCOUNTER — Other Ambulatory Visit: Payer: Self-pay

## 2014-05-25 ENCOUNTER — Ambulatory Visit (INDEPENDENT_AMBULATORY_CARE_PROVIDER_SITE_OTHER): Payer: Medicare Other | Admitting: Podiatrist

## 2014-05-25 VITALS — BP 122/66 | HR 74 | Resp 12

## 2014-05-25 DIAGNOSIS — Z1231 Encounter for screening mammogram for malignant neoplasm of breast: Secondary | ICD-10-CM

## 2014-05-25 DIAGNOSIS — M659 Synovitis and tenosynovitis, unspecified: Secondary | ICD-10-CM

## 2014-05-25 DIAGNOSIS — M7750 Other enthesopathy of unspecified foot: Secondary | ICD-10-CM

## 2014-05-25 NOTE — Patient Instructions (Signed)
Cryotherapy °Cryotherapy means treatment with cold. Ice or gel packs can be used to reduce both pain and swelling. Ice is the most helpful within the first 24 to 48 hours after an injury or flare-up from overusing a muscle or joint. Sprains, strains, spasms, burning pain, shooting pain, and aches can all be eased with ice. Ice can also be used when recovering from surgery. Ice is effective, has very few side effects, and is safe for most people to use. °PRECAUTIONS  °Ice is not a safe treatment option for people with: °· Raynaud phenomenon. This is a condition affecting small blood vessels in the extremities. Exposure to cold may cause your problems to return. °· Cold hypersensitivity. There are many forms of cold hypersensitivity, including: °¨ Cold urticaria. Red, itchy hives appear on the skin when the tissues begin to warm after being iced. °¨ Cold erythema. This is a red, itchy rash caused by exposure to cold. °¨ Cold hemoglobinuria. Red blood cells break down when the tissues begin to warm after being iced. The hemoglobin that carry oxygen are passed into the urine because they cannot combine with blood proteins fast enough. °· Numbness or altered sensitivity in the area being iced. °If you have any of the following conditions, do not use ice until you have discussed cryotherapy with your caregiver: °· Heart conditions, such as arrhythmia, angina, or chronic heart disease. °· High blood pressure. °· Healing wounds or open skin in the area being iced. °· Current infections. °· Rheumatoid arthritis. °· Poor circulation. °· Diabetes. °Ice slows the blood flow in the region it is applied. This is beneficial when trying to stop inflamed tissues from spreading irritating chemicals to surrounding tissues. However, if you expose your skin to cold temperatures for too long or without the proper protection, you can damage your skin or nerves. Watch for signs of skin damage due to cold. °HOME CARE INSTRUCTIONS °Follow  these tips to use ice and cold packs safely. °· Place a dry or damp towel between the ice and skin. A damp towel will cool the skin more quickly, so you may need to shorten the time that the ice is used. °· For a more rapid response, add gentle compression to the ice. °· Ice for no more than 10 to 20 minutes at a time. The bonier the area you are icing, the less time it will take to get the benefits of ice. °· Check your skin after 5 minutes to make sure there are no signs of a poor response to cold or skin damage. °· Rest 20 minutes or more between uses. °· Once your skin is numb, you can end your treatment. You can test numbness by very lightly touching your skin. The touch should be so light that you do not see the skin dimple from the pressure of your fingertip. When using ice, most people will feel these normal sensations in this order: cold, burning, aching, and numbness. °· Do not use ice on someone who cannot communicate their responses to pain, such as small children or people with dementia. °HOW TO MAKE AN ICE PACK °Ice packs are the most common way to use ice therapy. Other methods include ice massage, ice baths, and cryosprays. Muscle creams that cause a cold, tingly feeling do not offer the same benefits that ice offers and should not be used as a substitute unless recommended by your caregiver. °To make an ice pack, do one of the following: °· Place crushed ice or a   bag of frozen vegetables in a sealable plastic bag. Squeeze out the excess air. Place this bag inside another plastic bag. Slide the bag into a pillowcase or place a damp towel between your skin and the bag. °· Mix 3 parts water with 1 part rubbing alcohol. Freeze the mixture in a sealable plastic bag. When you remove the mixture from the freezer, it will be slushy. Squeeze out the excess air. Place this bag inside another plastic bag. Slide the bag into a pillowcase or place a damp towel between your skin and the bag. °SEEK MEDICAL CARE  IF: °· You develop white spots on your skin. This may give the skin a blotchy (mottled) appearance. °· Your skin turns blue or pale. °· Your skin becomes waxy or hard. °· Your swelling gets worse. °MAKE SURE YOU:  °· Understand these instructions. °· Will watch your condition. °· Will get help right away if you are not doing well or get worse. °Document Released: 04/29/2011 Document Revised: 01/17/2014 Document Reviewed: 04/29/2011 °Exit[employer]® Patient Information ©2015 [employer]. This information is not intended to replace advice given to you by your health care provider. Make sure you discuss any questions you have with your health care provider. ° °

## 2014-05-27 NOTE — Progress Notes (Signed)
Chief Complaint  Patient presents with  . Plantar Fasciitis    ''LT FOOT IS A LITTLE BETTER, BUT THE PAIN MOVED TO THE OUTSIDE AREA.''     HPI: Patient is 73 y.o. female who presents today for continued pain on the left heel.  The pain went from the medial heel at the plantar fascia insertion over to the outside of the ankle.    Physical Exam  Patient is awake, alert, and oriented x 3.  In no acute distress.  Vascular status is intact with palpable pedal pulses at 2/4 DP and PT bilateral and capillary refill time within normal limits. Neurological sensation is also intact bilaterally via Semmes Weinstein monofilament at 5/5 sites. Light touch, vibratory sensation, Achilles tendon reflex is intact. Dermatological exam reveals skin color, turger and texture as normal. No open lesions present.  Musculature intact with dorsiflexion, plantarflexion, inversion, eversion. No pain at the medial calcaneal tubercule.  Pain along the lateral ankle and foot is present along the insertion of the peroneus brevis tendon and along its course. Likely compensatory in nature .  Assessment: lateral ankle pain/ tendonitis  Plan: Under sterile technique I injected the area of maximal tenderness being careful to avoid the tendon structure with dexamethasone and Marcaine plain. Recommended a ankle brace and icing. She will call if this is not beneficial

## 2014-05-30 ENCOUNTER — Ambulatory Visit (INDEPENDENT_AMBULATORY_CARE_PROVIDER_SITE_OTHER): Payer: Medicare Other | Admitting: Obstetrics & Gynecology

## 2014-05-30 VITALS — BP 120/66 | HR 64 | Resp 16 | Wt 141.2 lb

## 2014-05-30 DIAGNOSIS — R300 Dysuria: Secondary | ICD-10-CM

## 2014-05-30 DIAGNOSIS — B373 Candidiasis of vulva and vagina: Secondary | ICD-10-CM

## 2014-05-30 DIAGNOSIS — B3731 Acute candidiasis of vulva and vagina: Secondary | ICD-10-CM

## 2014-05-30 LAB — POCT URINALYSIS DIPSTICK
Bilirubin, UA: NEGATIVE
Glucose, UA: NEGATIVE
KETONES UA: NEGATIVE
LEUKOCYTES UA: NEGATIVE
Nitrite, UA: NEGATIVE
PROTEIN UA: NEGATIVE
RBC UA: NEGATIVE
Urobilinogen, UA: NEGATIVE
pH, UA: 5

## 2014-05-30 MED ORDER — FLUCONAZOLE 150 MG PO TABS: 150.0000 mg | ORAL_TABLET | Freq: Once | ORAL | Status: DC

## 2014-05-30 NOTE — Progress Notes (Signed)
Subjective:     Patient ID: Sylvia Kennedy, female   DOB: [birth year], 73 y.o.   MRN: [id number]  HPI 64 G3P3 here for several week complaint of vulvar irritation.  Denies vaginal discharge.  There is some mild odor.  Thought she had yeast.  She still had some vaginal cream from prior rx several years ago.  Tried x 1 and had burning so she stopped..  Denies urinary symptoms.  Denies vaginal bleeding.    Review of Systems  All other systems reviewed and are negative.      Objective:   Physical Exam  Constitutional: She is oriented to person, place, and time. She appears well-developed and well-nourished.  Genitourinary: There is no rash, tenderness or lesion on the right labia. There is no rash, tenderness or lesion on the left labia. No erythema, tenderness or bleeding around the vagina. No signs of injury around the vagina. Vaginal discharge (watery) found.  Lymphadenopathy:       Right: No inguinal adenopathy present.       Left: No inguinal adenopathy present.  Neurological: She is alert and oriented to person, place, and time.  Skin: Skin is warm and dry.  Psychiatric: She has a normal mood and affect.   Wet smear:  Ph 4.0.  Saline: ne -trich, neg clue cells, occ WBCs  KOH:  Pos yeast    Assessment:     Yeast vaginitis     Plan:     Diflucan 150mg  po x 1, repeat 48 hours If this doesn't completely resolve symptoms, pt may need to start vaginal estrogen as well.  Will wait and see.

## 2014-06-02 ENCOUNTER — Telehealth: Payer: Self-pay | Admitting: Obstetrics & Gynecology

## 2014-06-02 NOTE — Telephone Encounter (Signed)
Pt calling to give an update to Dr. Sabra Heck she is doing better and is going to take the second pill.

## 2014-06-02 NOTE — Telephone Encounter (Signed)
Great.  Thank you.  Encounter closed.

## 2014-06-05 ENCOUNTER — Encounter: Payer: Self-pay | Admitting: Obstetrics & Gynecology

## 2014-06-29 ENCOUNTER — Ambulatory Visit (INDEPENDENT_AMBULATORY_CARE_PROVIDER_SITE_OTHER): Payer: Medicare Other | Admitting: Podiatrist

## 2014-06-29 ENCOUNTER — Encounter: Payer: Self-pay | Admitting: Podiatrist

## 2014-06-29 VITALS — BP 143/68 | HR 68 | Resp 16

## 2014-06-29 DIAGNOSIS — G5752 Tarsal tunnel syndrome, left lower limb: Secondary | ICD-10-CM

## 2014-06-29 DIAGNOSIS — M25572 Pain in left ankle and joints of left foot: Secondary | ICD-10-CM

## 2014-07-01 NOTE — Progress Notes (Signed)
Chief Complaint  Patient presents with  . Foot Pain    Follow up tendonitis lateral ankle/foot left    "Its not getting better"     HPI: Patient is 73 y.o. female who presents today for continued pain on the left ankle.  She states the pain on the lateral side of the foot is improved however now she has pain near the ankle as she points to the sinus tarsi area.    Physical Exam  Patient is awake, alert, and oriented x 3.  In no acute distress.  Vascular status is intact with palpable pedal pulses at 2/4 DP and PT bilateral and capillary refill time within normal limits. Neurological sensation is also intact bilaterally via Semmes Weinstein monofilament at 5/5 sites. Light touch, vibratory sensation, Achilles tendon reflex is intact. Dermatological exam reveals skin color, turger and texture as normal. No open lesions present.  Musculature intact with dorsiflexion, plantarflexion, inversion, eversion.  Pain at the sinus tarsi is palpated.  Pain along the lateral foot near the peroneus brevis is improved since the last visit.    Assessment: sinus tarsi syndrome, capsulitis  Plan: recommended a injection into the sinus tarsi and the patient agreed.  This was carried out today without complication.  A trilock ankle brace was dispensed.  She will call when she gets back from her trip to [place].  If there is still pain, we will consider boot therapy and possibly physical therapy.

## 2014-07-04 ENCOUNTER — Ambulatory Visit
Admission: RE | Admit: 2014-07-04 | Discharge: 2014-07-04 | Disposition: A | Discharge status: Home / Self Care | Payer: Medicare Other | Source: Ambulatory Visit

## 2014-07-04 DIAGNOSIS — Z1231 Encounter for screening mammogram for malignant neoplasm of breast: Secondary | ICD-10-CM

## 2014-07-12 ENCOUNTER — Ambulatory Visit: Payer: Medicare Other | Admitting: Obstetrics & Gynecology

## 2014-07-18 ENCOUNTER — Encounter: Payer: Self-pay | Admitting: Podiatrist

## 2014-09-12 ENCOUNTER — Telehealth: Payer: Self-pay | Admitting: *Deleted

## 2014-09-12 NOTE — Telephone Encounter (Signed)
Pt states she is still having a problem, should she see a orthopedic doctor? Pt states she has had 4 shots and it has moved to the center of her foot.  Pt states the orthodpedic doctors won't schedule until the chart notes are faxed.  Pt states she will call with the office information.

## 2014-09-13 ENCOUNTER — Other Ambulatory Visit: Payer: Self-pay

## 2014-09-13 ENCOUNTER — Encounter: Payer: Self-pay | Admitting: Obstetrics & Gynecology

## 2014-09-13 ENCOUNTER — Ambulatory Visit (INDEPENDENT_AMBULATORY_CARE_PROVIDER_SITE_OTHER): Payer: Medicare Other | Admitting: Obstetrics & Gynecology

## 2014-09-13 VITALS — BP 146/70 | HR 78 | Resp 14 | Ht 63.25 in | Wt 141.8 lb

## 2014-09-13 DIAGNOSIS — N898 Other specified noninflammatory disorders of vagina: Secondary | ICD-10-CM

## 2014-09-13 DIAGNOSIS — Z124 Encounter for screening for malignant neoplasm of cervix: Secondary | ICD-10-CM

## 2014-09-13 DIAGNOSIS — Z0000 Encounter for general adult medical examination without abnormal findings: Secondary | ICD-10-CM

## 2014-09-13 DIAGNOSIS — L298 Other pruritus: Secondary | ICD-10-CM

## 2014-09-13 DIAGNOSIS — Z01419 Encounter for gynecological examination (general) (routine) without abnormal findings: Secondary | ICD-10-CM

## 2014-09-13 LAB — POCT URINALYSIS DIPSTICK
PH UA: 5
Urobilinogen, UA: NEGATIVE

## 2014-09-13 LAB — HEMOGLOBIN, FINGERSTICK: Hemoglobin, fingerstick: 15.1 g/dL (ref 12.0–16.0)

## 2014-09-13 MED ORDER — ALPRAZOLAM 0.25 MG PO TABS: 0.2500 mg | ORAL_TABLET | Freq: Every evening | ORAL | Status: DC | PRN

## 2014-09-13 NOTE — Telephone Encounter (Signed)
Yes ma'am, I sure will.

## 2014-09-13 NOTE — Telephone Encounter (Signed)
If she is requesting her medical records to be sent to an orthopedic [place] office I will be happy to do that once she signs a form authorizing [place] to release her information.

## 2014-09-13 NOTE — Telephone Encounter (Signed)
Sylvia Kennedy, as I understand Dr. Valentina Lucks likes to review her notes prior to sending out so check with her and then call pt concerning the medical record release.  Thanks,  Marcy Siren

## 2014-09-13 NOTE — Progress Notes (Signed)
73 y.o. G3P3 MarriedCaucasianF here for annual exam.  Youngest son was diagnosed with Hodgkin's Lymphoma the week before Christmas.  Stage 3.  Was diagnosed. Of course, she is anxious for him.    No vaginal bleeding.  Had yeast earlier this year.  Occasionally stil lhas itching.    Knows BMD is due.  Will schedule with Dr. Dagmar Hait.  Has appt in 2/16 for physical.     Patient's last menstrual period was 09/16/1993.          Sexually active: No.  The current method of family planning is status post hysterectomy.    Exercising: Yes.    Walking, hand weights 3 miles, 4x/wk Smoker:  no  Health Maintenance: Pap:  02/13/12 Negative  History of abnormal Pap:  no MMG:  07/04/14 Bi-Rads 1: Negative  Colonoscopy: 04/16/08 with DR. Buccini BMD:   08/07/2010 -2.5/-2.5 17%/2.1%  TDaP: Due in 2016  Screening Labs: yes, Hb today: 15.1 ;  Urine today: Trace WBC's   reports that she has never smoked. She has never used smokeless tobacco. She reports that she does not drink alcohol or use illicit drugs.  Past Medical History  Diagnosis Date  . Clostridium difficile diarrhea   . Osteopenia     may be starting the prolia injection/in between osteopenia and osteoporosis    Past Surgical History  Procedure Laterality Date  . Abdominal hysterectomy      TAH/BSO  . Appendectomy    . Tubal ligation      Current Outpatient Prescriptions  Medication Sig Dispense Refill  . acidophilus (RISAQUAD) CAPS capsule Take 1 capsule by mouth daily.    Marland Kitchen alum & mag hydroxide-simeth (MAALOX/MYLANTA) 200-200-20 MG/5ML suspension Take 15 mLs by mouth every 6 (six) hours as needed for indigestion or heartburn.    . busPIRone (BUSPAR) 7.5 MG tablet Take 3.75 mg by mouth 2 (two) times daily. Takes 1/2 tablet    . cholecalciferol (VITAMIN D) 1000 UNITS tablet Take 1,000 Units by mouth daily.    . clobetasol ointment (TEMOVATE) 0.05 % APPLY TOPICALLY 2 (TWO) TIMES DAILY. APPLY AS DIRECTED TWICE DAILY 30 g 0  . fish  oil-omega-3 fatty acids 1000 MG capsule Take 1 g by mouth daily.    . fluconazole (DIFLUCAN) 150 MG tablet Take 1 tablet (150 mg total) by mouth once. Take one tablet.  Repeat in 72 hours if symptoms are not completely resolved. 2 tablet 0  . fluticasone (FLONASE) 50 MCG/ACT nasal spray Place 1 spray into both nostrils daily.    . methocarbamol (ROBAXIN) 500 MG tablet Take 1 tablet (500 mg total) by mouth 2 (two) times daily. 20 tablet 0  . methylPREDNIsolone (MEDROL DOSPACK) 4 MG tablet     . Multiple Vitamins-Minerals (MULTIVITAMIN WITH MINERALS) tablet Take 1 tablet by mouth daily.    Marland Kitchen omeprazole (PRILOSEC) 20 MG capsule Take 20 mg by mouth daily.      No current facility-administered medications for this visit.    Family History  Problem Relation Age of Onset  . Breast cancer Maternal Aunt 60  . Heart disease Maternal Grandmother   . Heart attack Paternal Grandmother   . Thyroid disease Mother   . Heart attack Brother     ROS:  Pertinent items are noted in HPI.  Otherwise, a comprehensive ROS was negative.  Exam:   General appearance: alert, cooperative and appears stated age Head: Normocephalic, without obvious abnormality, atraumatic Neck: no adenopathy, supple, symmetrical, trachea midline and thyroid normal to  inspection and palpation Lungs: clear to auscultation bilaterally Breasts: normal appearance, no masses or tenderness Heart: regular rate and rhythm Abdomen: soft, non-tender; bowel sounds normal; no masses,  no organomegaly Extremities: extremities normal, atraumatic, no cyanosis or edema Skin: Skin color, texture, turgor normal. No rashes or lesions Lymph nodes: Cervical, supraclavicular, and axillary nodes normal. No abnormal inguinal nodes palpated Neurologic: Grossly normal   Pelvic: External genitalia:  no lesions              Urethra:  normal appearing urethra with no masses, tenderness or lesions              Bartholins and Skenes: normal                  Vagina: normal appearing vagina with normal color and discharge, no lesions              Cervix: absent              Pap taken: No. Bimanual Exam:  Uterus:  uterus absent              Adnexa: no mass, fullness, tenderness               Rectovaginal: Confirms               Anus:  normal sphincter tone, no lesions  Chaperone was present for exam.  A:  Well Woman, normal exam H/o c. Diff.  Well controlled. S/P TAH/BSO Osteoporosis [place] due to son's cancer Recurrent vaginal dryness that I feel is atrophic changes and pt feels is yeast  P: Mammogram yearly pap smear obtained today Will call when need RF for Estrace cream prn Labs with Dr. Kirby Crigler obtained Xanax 0.25mg  prn anxiety.  #30/0RF.  Will d/w Dr. Ronald Lobo having her BMD at his office. return annually or prn

## 2014-09-14 LAB — WET PREP BY MOLECULAR PROBE
CANDIDA SPECIES: NEGATIVE
GARDNERELLA VAGINALIS: NEGATIVE
Trichomonas vaginosis: NEGATIVE

## 2014-09-14 LAB — IPS PAP SMEAR ONLY

## 2014-09-14 NOTE — Telephone Encounter (Signed)
Thanks [employer]. I will start getting them together.

## 2014-09-14 NOTE — Telephone Encounter (Signed)
Sure, that's fine to send in my notes-- I'm happy for her to get a second opinion.  My next suggestion would be physical therapy.  If she would like to try that I am also happy to send over a referral.  Thanks!

## 2014-09-15 ENCOUNTER — Telehealth: Payer: Self-pay

## 2014-09-15 NOTE — Telephone Encounter (Signed)
-----   Message from Lyman Speller, MD sent at 09/14/2014  5:35 PM EST ----- Inform pt that affirm was negative.  There is no yeast.  Pap was normal.  I think she should increase the estrogen cream or she could use some olive oil or coconut oil for "itching sensation" that is due to lack of estrogen.

## 2014-09-15 NOTE — Telephone Encounter (Signed)
Attempted to reach patient at [phone] but line was busy. Attempted to reach patient at [phone] but recording states "Due to network difficulties your call can not be completed at this time. Please try again later."

## 2014-09-21 NOTE — Telephone Encounter (Signed)
Spoke with patient. Advised patient of message as seen below from [facility] [place]. Patient is agreeable and verbalizes understanding. Patient would like verification on blood drawn for hemoglobin level. "I questioned the girl because she took two vials of blood when I was there and on my paper it say a fingerstick. Is that what is done now?" Advised patient for each patient hemoglobin level is checked. Advised we drawn addition blood to cover any labs that may need to be added at visit. This way we only have to stick the patient once for lab work if anything needs to be added. Patient is agreeable.  Routing to provider for final review. Patient agreeable to disposition. Will close encounter

## 2014-11-17 ENCOUNTER — Other Ambulatory Visit: Payer: Self-pay | Admitting: Obstetrics & Gynecology

## 2014-11-18 NOTE — Telephone Encounter (Signed)
Rx printed, signed by Dr. Sabra Heck and faxed to [facility]

## 2014-11-18 NOTE — Telephone Encounter (Signed)
Medication refill request: Xanax 0.25 mg  Last AEX:  09/13/14 with SM  Next AEX: 11/09/15 with SM  Last MMG (if hormonal medication request): N/A Refill authorized: Please advise.

## 2014-11-21 ENCOUNTER — Emergency Department (HOSPITAL_COMMUNITY): Payer: Commercial Managed Care - HMO

## 2014-11-21 ENCOUNTER — Observation Stay (HOSPITAL_COMMUNITY)
Admission: EM | Admit: 2014-11-21 | Discharge: 2014-11-22 | Disposition: A | Discharge status: Home / Self Care | Payer: Commercial Managed Care - HMO | Attending: Internal Medicine | Admitting: Internal Medicine

## 2014-11-21 ENCOUNTER — Other Ambulatory Visit (HOSPITAL_COMMUNITY): Payer: Self-pay

## 2014-11-21 ENCOUNTER — Encounter (HOSPITAL_COMMUNITY): Payer: Self-pay | Admitting: Emergency Medicine

## 2014-11-21 DIAGNOSIS — M8580 Other specified disorders of bone density and structure, unspecified site: Secondary | ICD-10-CM | POA: Insufficient documentation

## 2014-11-21 DIAGNOSIS — R197 Diarrhea, unspecified: Secondary | ICD-10-CM | POA: Diagnosis present

## 2014-11-21 DIAGNOSIS — F419 Anxiety disorder, unspecified: Secondary | ICD-10-CM

## 2014-11-21 DIAGNOSIS — I447 Left bundle-branch block, unspecified: Secondary | ICD-10-CM | POA: Diagnosis not present

## 2014-11-21 DIAGNOSIS — R1110 Vomiting, unspecified: Secondary | ICD-10-CM | POA: Diagnosis not present

## 2014-11-21 DIAGNOSIS — Z8619 Personal history of other infectious and parasitic diseases: Secondary | ICD-10-CM | POA: Insufficient documentation

## 2014-11-21 DIAGNOSIS — E860 Dehydration: Secondary | ICD-10-CM | POA: Diagnosis not present

## 2014-11-21 DIAGNOSIS — M542 Cervicalgia: Secondary | ICD-10-CM | POA: Diagnosis not present

## 2014-11-21 DIAGNOSIS — R109 Unspecified abdominal pain: Secondary | ICD-10-CM

## 2014-11-21 DIAGNOSIS — Z79899 Other long term (current) drug therapy: Secondary | ICD-10-CM | POA: Insufficient documentation

## 2014-11-21 DIAGNOSIS — K529 Noninfective gastroenteritis and colitis, unspecified: Secondary | ICD-10-CM | POA: Diagnosis present

## 2014-11-21 HISTORY — DX: Left bundle-branch block, unspecified: I44.7

## 2014-11-21 LAB — CBC WITH DIFFERENTIAL/PLATELET
BASOS ABS: 0 10*3/uL (ref 0.0–0.1)
BASOS PCT: 0 % (ref 0–1)
EOS PCT: 0 % (ref 0–5)
Eosinophils Absolute: 0 10*3/uL (ref 0.0–0.7)
HEMATOCRIT: 43.2 % (ref 36.0–46.0)
HEMOGLOBIN: 14.9 g/dL (ref 12.0–15.0)
LYMPHS ABS: 0.3 10*3/uL — AB (ref 0.7–4.0)
Lymphocytes Relative: 3 % — ABNORMAL LOW (ref 12–46)
MCH: 30.6 pg (ref 26.0–34.0)
MCHC: 34.5 g/dL (ref 30.0–36.0)
MCV: 88.7 fL (ref 78.0–100.0)
MONOS PCT: 2 % — AB (ref 3–12)
Monocytes Absolute: 0.3 10*3/uL (ref 0.1–1.0)
NEUTROS ABS: 11.3 10*3/uL — AB (ref 1.7–7.7)
Neutrophils Relative %: 95 % — ABNORMAL HIGH (ref 43–77)
Platelets: 185 10*3/uL (ref 150–400)
RBC: 4.87 MIL/uL (ref 3.87–5.11)
RDW: 12.7 % (ref 11.5–15.5)
WBC: 11.8 10*3/uL — AB (ref 4.0–10.5)

## 2014-11-21 LAB — CREATININE, SERUM
Creatinine, Ser: 0.68 mg/dL (ref 0.50–1.10)
GFR, EST NON AFRICAN AMERICAN: 86 mL/min — AB (ref >90)

## 2014-11-21 LAB — CBC
HEMATOCRIT: 37 % (ref 36.0–46.0)
Hemoglobin: 12.5 g/dL (ref 12.0–15.0)
MCH: 30.1 pg (ref 26.0–34.0)
MCHC: 33.8 g/dL (ref 30.0–36.0)
MCV: 89.2 fL (ref 78.0–100.0)
PLATELETS: 154 10*3/uL (ref 150–400)
RBC: 4.15 MIL/uL (ref 3.87–5.11)
RDW: 13 % (ref 11.5–15.5)
WBC: 7.1 10*3/uL (ref 4.0–10.5)

## 2014-11-21 LAB — TROPONIN I: Troponin I: <0.03 ng/mL (ref <0.031)

## 2014-11-21 LAB — COMPREHENSIVE METABOLIC PANEL
ALT: 38 U/L — ABNORMAL HIGH (ref 0–35)
AST: 56 U/L — ABNORMAL HIGH (ref 0–37)
Albumin: 3.9 g/dL (ref 3.5–5.2)
Alkaline Phosphatase: 75 U/L (ref 39–117)
Anion gap: 6 (ref 5–15)
BUN: 16 mg/dL (ref 6–23)
CHLORIDE: 106 mmol/L (ref 96–112)
CO2: 25 mmol/L (ref 19–32)
CREATININE: 0.65 mg/dL (ref 0.50–1.10)
Calcium: 8.9 mg/dL (ref 8.4–10.5)
GFR calc Af Amer: >90 mL/min (ref >90)
GFR calc non Af Amer: 87 mL/min — ABNORMAL LOW (ref >90)
Glucose, Bld: 140 mg/dL — ABNORMAL HIGH (ref 70–99)
POTASSIUM: 4 mmol/L (ref 3.5–5.1)
Sodium: 137 mmol/L (ref 135–145)
Total Bilirubin: 1.9 mg/dL — ABNORMAL HIGH (ref 0.3–1.2)
Total Protein: 7.1 g/dL (ref 6.0–8.3)

## 2014-11-21 LAB — I-STAT TROPONIN, ED
Troponin i, poc: 0 ng/mL (ref 0.00–0.08)
Troponin i, poc: 0 ng/mL (ref 0.00–0.08)

## 2014-11-21 LAB — LIPASE, BLOOD: Lipase: 31 U/L (ref 11–59)

## 2014-11-21 MED ORDER — BUSPIRONE HCL 7.5 MG PO TABS
3.7500 mg | ORAL_TABLET | Freq: Every morning | ORAL | Status: DC
  Filled 2014-11-21: qty 1

## 2014-11-21 MED ORDER — SODIUM CHLORIDE 0.9 % IV BOLUS (SEPSIS)
1000.0000 mL | Freq: Once | INTRAVENOUS | Status: AC
  Administered 2014-11-21: 1000 mL via INTRAVENOUS

## 2014-11-21 MED ORDER — METRONIDAZOLE IN NACL 5-0.79 MG/ML-% IV SOLN
500.0000 mg | Freq: Once | INTRAVENOUS | Status: DC
  Filled 2014-11-21: qty 100

## 2014-11-21 MED ORDER — ALPRAZOLAM 0.25 MG PO TABS: 0.2500 mg | ORAL_TABLET | Freq: Two times a day (BID) | ORAL | Status: DC | PRN

## 2014-11-21 MED ORDER — ONDANSETRON HCL 4 MG PO TABS: 4.0000 mg | ORAL_TABLET | Freq: Four times a day (QID) | ORAL | Status: DC | PRN

## 2014-11-21 MED ORDER — OMEGA-3-ACID ETHYL ESTERS 1 G PO CAPS
1.0000 g | ORAL_CAPSULE | Freq: Every day | ORAL | Status: DC
  Administered 2014-11-22: 1 g via ORAL
  Filled 2014-11-21 (×2): qty 1

## 2014-11-21 MED ORDER — SODIUM CHLORIDE 0.9 % IV SOLN
INTRAVENOUS | Status: DC
  Administered 2014-11-21 – 2014-11-22 (×2): via INTRAVENOUS

## 2014-11-21 MED ORDER — MULTI-VITAMIN/MINERALS PO TABS: 1.0000 | ORAL_TABLET | Freq: Every day | ORAL | Status: DC

## 2014-11-21 MED ORDER — ACETAMINOPHEN 650 MG RE SUPP: 650.0000 mg | Freq: Four times a day (QID) | RECTAL | Status: DC | PRN

## 2014-11-21 MED ORDER — RISAQUAD PO CAPS
1.0000 | ORAL_CAPSULE | Freq: Every day | ORAL | Status: DC
  Administered 2014-11-22: 1 via ORAL
  Filled 2014-11-21 (×3): qty 1

## 2014-11-21 MED ORDER — ACETAMINOPHEN 325 MG PO TABS: 650.0000 mg | ORAL_TABLET | Freq: Four times a day (QID) | ORAL | Status: DC | PRN

## 2014-11-21 MED ORDER — GUAIFENESIN-DM 100-10 MG/5ML PO SYRP
5.0000 mL | ORAL_SOLUTION | ORAL | Status: DC | PRN
  Filled 2014-11-21: qty 5

## 2014-11-21 MED ORDER — ALUM & MAG HYDROXIDE-SIMETH 200-200-20 MG/5ML PO SUSP: 30.0000 mL | Freq: Four times a day (QID) | ORAL | Status: DC | PRN

## 2014-11-21 MED ORDER — PANTOPRAZOLE SODIUM 40 MG PO TBEC
40.0000 mg | DELAYED_RELEASE_TABLET | Freq: Every day | ORAL | Status: DC
  Administered 2014-11-21 – 2014-11-22 (×2): 40 mg via ORAL
  Filled 2014-11-21 (×2): qty 1

## 2014-11-21 MED ORDER — ONDANSETRON HCL 4 MG/2ML IJ SOLN
4.0000 mg | Freq: Once | INTRAMUSCULAR | Status: AC
  Administered 2014-11-21: 4 mg via INTRAVENOUS

## 2014-11-21 MED ORDER — SODIUM CHLORIDE 0.9 % IV SOLN: INTRAVENOUS | Status: AC

## 2014-11-21 MED ORDER — ALBUTEROL SULFATE (2.5 MG/3ML) 0.083% IN NEBU: 2.5000 mg | INHALATION_SOLUTION | RESPIRATORY_TRACT | Status: DC | PRN

## 2014-11-21 MED ORDER — OXYCODONE HCL 5 MG PO TABS: 5.0000 mg | ORAL_TABLET | ORAL | Status: DC | PRN

## 2014-11-21 MED ORDER — ADULT MULTIVITAMIN W/MINERALS CH
1.0000 | ORAL_TABLET | Freq: Every day | ORAL | Status: DC
  Administered 2014-11-22: 1 via ORAL
  Filled 2014-11-21 (×3): qty 1

## 2014-11-21 MED ORDER — SALINE SPRAY 0.65 % NA SOLN
1.0000 | NASAL | Status: DC | PRN
  Filled 2014-11-21 (×2): qty 44

## 2014-11-21 MED ORDER — OMEGA-3 FATTY ACIDS 1000 MG PO CAPS: 1.0000 g | ORAL_CAPSULE | Freq: Every day | ORAL | Status: DC

## 2014-11-21 MED ORDER — SODIUM CHLORIDE 0.9 % IJ SOLN: 3.0000 mL | Freq: Two times a day (BID) | INTRAMUSCULAR | Status: DC

## 2014-11-21 MED ORDER — VITAMIN D3 25 MCG (1000 UNIT) PO TABS
1000.0000 [IU] | ORAL_TABLET | Freq: Every day | ORAL | Status: DC
  Administered 2014-11-22: 1000 [IU] via ORAL
  Filled 2014-11-21 (×3): qty 1

## 2014-11-21 MED ORDER — ONDANSETRON HCL 4 MG/2ML IJ SOLN
4.0000 mg | Freq: Once | INTRAMUSCULAR | Status: DC
  Filled 2014-11-21: qty 2

## 2014-11-21 MED ORDER — ONDANSETRON HCL 4 MG/2ML IJ SOLN: 4.0000 mg | Freq: Four times a day (QID) | INTRAMUSCULAR | Status: DC | PRN

## 2014-11-21 MED ORDER — ONDANSETRON HCL 4 MG/2ML IJ SOLN
4.0000 mg | Freq: Once | INTRAMUSCULAR | Status: AC
  Administered 2014-11-21: 4 mg via INTRAVENOUS
  Filled 2014-11-21: qty 2

## 2014-11-21 MED ORDER — HEPARIN SODIUM (PORCINE) 5000 UNIT/ML IJ SOLN
5000.0000 [IU] | Freq: Three times a day (TID) | INTRAMUSCULAR | Status: DC
  Administered 2014-11-21 – 2014-11-22 (×2): 5000 [IU] via SUBCUTANEOUS
  Filled 2014-11-21 (×4): qty 1

## 2014-11-21 NOTE — ED Notes (Signed)
Attempting to move pt to pod C and Dr. Sloan Leiter requesting to see pt in current room.

## 2014-11-21 NOTE — ED Notes (Signed)
EKG given to Dr. Yao.  

## 2014-11-21 NOTE — ED Notes (Signed)
MD at bedside. 

## 2014-11-21 NOTE — ED Notes (Signed)
Pt reports N/V/D since 2300 last night, at least 10 episodes of each, pt reports neck pain as well. Pt denies fever.
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
## 2014-11-21 NOTE — ED Provider Notes (Signed)
CSN: [id number]     Arrival date & time 11/21/14  6295 History   First MD Initiated Contact with Patient 11/21/14 [phone]     Chief Complaint  Patient presents with  . Emesis  . Neck Pain  . Diarrhea     (Consider location/radiation/quality/duration/timing/severity/associated sxs/prior Treatment) The history is provided by the patient.  Sylvia Kennedy is a 74 y.o. female hx of C diff, cervical radiculopathy here with vomiting, diarrhea. Woke up 11 PM yesterday with 10 episodes of vomiting that is nonbilious and nonbloody. She also had persistent diarrhea. Denies any recent antibiotic use but she does have a remote history C. Difficile. Denies any recent travel. She had previous abdominal hysterectomy and appendectomy. She does have worsening left sided neck pain as well but has chronic left neck radiculopathy. Denies any arm pain or weakness. Denies any chest pain. Of note patient has significant family history of CAD.    Past Medical History  Diagnosis Date  . Clostridium difficile diarrhea   . Osteopenia     may be starting the prolia injection/in between osteopenia and osteoporosis   Past Surgical History  Procedure Laterality Date  . Abdominal hysterectomy      TAH/BSO  . Appendectomy    . Tubal ligation     Family History  Problem Relation Age of Onset  . Breast cancer Maternal Aunt 60  . Heart disease Maternal Grandmother   . Heart attack Paternal Grandmother   . Thyroid disease Mother   . Heart attack Brother   . Hodgkin's lymphoma Son 52   History  Substance Use Topics  . Smoking status: Never Smoker   . Smokeless tobacco: Never Used  . Alcohol Use: No   OB History    Gravida Para Term Preterm AB TAB SAB Ectopic Multiple Living   3 3        3      Review of Systems  Gastrointestinal: Positive for vomiting and diarrhea.  Musculoskeletal: Positive for neck pain.  All other systems reviewed and are negative.     Allergies  Ciprofloxacin; Darvon; and Mtx  support  Home Medications   Prior to Admission medications   Medication Sig Start Date End Date Taking? Authorizing Provider  acidophilus (RISAQUAD) CAPS capsule Take 1 capsule by mouth daily.   Yes Historical Provider, MD  ALPRAZolam Duanne Moron) 0.25 MG tablet TAKE 1 TABLET BY MOUTH AT BEDTIME AS NEEDED FOR ANXIETY 11/18/14  Yes Megan Salon, MD  busPIRone (BUSPAR) 7.5 MG tablet Take 3.75 mg by mouth every morning. Takes 1/2 tablet   Yes Historical Provider, MD  cholecalciferol (VITAMIN D) 1000 UNITS tablet Take 1,000 Units by mouth daily.   Yes Historical Provider, MD  fish oil-omega-3 fatty acids 1000 MG capsule Take 1 g by mouth daily.   Yes Historical Provider, MD  Multiple Vitamins-Minerals (MULTIVITAMIN WITH MINERALS) tablet Take 1 tablet by mouth daily.   Yes Historical Provider, MD  naproxen sodium (ANAPROX) 220 MG tablet Take 220 mg by mouth daily as needed (pain).    Yes Historical Provider, MD  omeprazole (PRILOSEC) 20 MG capsule Take 20 mg by mouth daily.  02/25/13  Yes Historical Provider, MD   BP 105/54 mmHg  Pulse 99  Temp(Src) 97.7 F (36.5 C) (Oral)  Resp 17  Ht 5\' 3"  (1.6 m)  Wt 145 lb (65.772 kg)  BMI 25.69 kg/m2  SpO2 98%  LMP 09/16/1993 Physical Exam  Constitutional: She is oriented to person, place, and time.  Dehydrated  HENT:  Head: Normocephalic.  MM dry   Eyes: Conjunctivae are normal. Pupils are equal, round, and reactive to light.  Neck: Normal range of motion. Neck supple.  Cardiovascular: Normal rate, regular rhythm and normal heart sounds.   Pulmonary/Chest: Effort normal and breath sounds normal. No respiratory distress. She has no wheezes. She has no rales.  Abdominal: Soft. Bowel sounds are normal. She exhibits no distension. There is no tenderness. There is no rebound.  Musculoskeletal: Normal range of motion. She exhibits no edema or tenderness.  Neurological: She is alert and oriented to person, place, and time. No cranial nerve deficit.  Coordination normal.  Skin: Skin is warm and dry.  Psychiatric: Her behavior is normal. Judgment normal.  Nursing note and vitals reviewed.   ED Course  Procedures (including critical care time) Labs Review Labs Reviewed  CBC WITH DIFFERENTIAL/PLATELET - Abnormal; Notable for the following:    WBC 11.8 (*)    Neutrophils Relative % 95 (*)    Neutro Abs 11.3 (*)    Lymphocytes Relative 3 (*)    Lymphs Abs 0.3 (*)    Monocytes Relative 2 (*)    All other components within normal limits  COMPREHENSIVE METABOLIC PANEL - Abnormal; Notable for the following:    Glucose, Bld 140 (*)    AST 56 (*)    ALT 38 (*)    Total Bilirubin 1.9 (*)    GFR calc non Af Amer 87 (*)    All other components within normal limits  CLOSTRIDIUM DIFFICILE BY PCR  LIPASE, BLOOD  URINALYSIS, ROUTINE W REFLEX MICROSCOPIC  I-STAT TROPOININ, ED  I-STAT TROPOININ, ED    Imaging Review Dg Abd Acute W/chest  11/21/2014   CLINICAL DATA:  74 year old female with abdominal pain, nausea and vomiting  EXAM: ACUTE ABDOMEN SERIES (ABDOMEN 2 VIEW & CHEST 1 VIEW)  COMPARISON:  Prior CT abdomen/ pelvis 11/02/2011  FINDINGS: Cardiac and mediastinal contours are within normal limits. No focal airspace consolidation, pulmonary edema, pleural effusion or pneumothorax. Mild bronchitic change and interstitial prominence.  No evidence of bowel obstruction or free air. No organomegaly or abnormal calcifications. No acute osseous abnormality.  IMPRESSION: Negative abdominal radiographs.  No acute cardiopulmonary disease.   Electronically Signed   By: Jacqulynn Cadet M.D.   On: 11/21/2014 07:51     EKG Interpretation None      MDM   Final diagnoses:  Abdominal pain   Sylvia Kennedy is a 74 y.o. female here with vomiting, diarrhea. Likely viral syndrome. However, has hx of ab surgeries so will get xray to r/o SBO. Also EKG showed new onset LBBB but previous was 2009 and she has no chest pain so will get trop to r/o MI.    11:22 AM Delta trop neg. LFTs slightly elevated. WBC 12. Has more diarrhea here and unable to keep crackers down. Will admit for observation. Given hx of C diff, I ordered C diff and started flagyl empirically.     Wandra Arthurs, MD 11/21/14 [phone]

## 2014-11-22 ENCOUNTER — Encounter (HOSPITAL_COMMUNITY): Payer: Self-pay | Admitting: Internal Medicine

## 2014-11-22 LAB — CBC
HCT: 35.4 % — ABNORMAL LOW (ref 36.0–46.0)
HEMOGLOBIN: 12 g/dL (ref 12.0–15.0)
MCH: 29.6 pg (ref 26.0–34.0)
MCHC: 33.9 g/dL (ref 30.0–36.0)
MCV: 87.4 fL (ref 78.0–100.0)
Platelets: 156 10*3/uL (ref 150–400)
RBC: 4.05 MIL/uL (ref 3.87–5.11)
RDW: 13 % (ref 11.5–15.5)
WBC: 5.8 10*3/uL (ref 4.0–10.5)

## 2014-11-22 LAB — BASIC METABOLIC PANEL
ANION GAP: 8 (ref 5–15)
BUN: 8 mg/dL (ref 6–23)
CHLORIDE: 108 mmol/L (ref 96–112)
CO2: 22 mmol/L (ref 19–32)
Calcium: 8.2 mg/dL — ABNORMAL LOW (ref 8.4–10.5)
Creatinine, Ser: 0.61 mg/dL (ref 0.50–1.10)
GFR calc Af Amer: >90 mL/min (ref >90)
GFR calc non Af Amer: 89 mL/min — ABNORMAL LOW (ref >90)
Glucose, Bld: 97 mg/dL (ref 70–99)
Potassium: 3.3 mmol/L — ABNORMAL LOW (ref 3.5–5.1)
SODIUM: 138 mmol/L (ref 135–145)

## 2014-11-22 LAB — TROPONIN I

## 2014-11-22 MED ORDER — ONDANSETRON HCL 4 MG PO TABS: 4.0000 mg | ORAL_TABLET | Freq: Four times a day (QID) | ORAL | Status: DC | PRN
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
## 2014-11-22 NOTE — Progress Notes (Signed)
NURSING PROGRESS NOTE  Sylvia Kennedy [id number] Discharge Data: 11/22/2014 5:01 PM Attending Provider: No att. providers found SWN:IOEV,[id number] R, MD     Glenis Smoker to be D/C'd Home per MD order.  Discussed with the patient the After Visit Summary and all questions fully answered. All IV's discontinued with no bleeding noted. All belongings returned to patient for patient to take home.    Patient's daughter called because the patient was concerned about her diarrhea and forgot what the doctor had told her prior to d/c. I notified MD. Eliseo Squires to get clarity. Returned phone call to daughter to clarify that it is viral and will run its course. Encouraged to drink plenty of water and not take imodium. [facility] [address] in [place] for patient to pickup.   Last Vital Signs:  Blood pressure 122/64, pulse 77, temperature 98.8 F (37.1 C), temperature source Oral, resp. rate 18, height 5\' 3"  (1.6 m), weight 71.5 kg (157 lb 10.1 oz), last menstrual period 09/16/1993, SpO2 96 %.  Discharge Medication List   Medication List    TAKE these medications        acidophilus Caps capsule  Take 1 capsule by mouth daily.     ALPRAZolam 0.25 MG tablet  Commonly known as:  XANAX  TAKE 1 TABLET BY MOUTH AT BEDTIME AS NEEDED FOR ANXIETY     busPIRone 7.5 MG tablet  Commonly known as:  BUSPAR  Take 3.75 mg by mouth every morning. Takes 1/2 tablet     cholecalciferol 1000 UNITS tablet  Commonly known as:  VITAMIN D  Take 1,000 Units by mouth daily.     fish oil-omega-3 fatty acids 1000 MG capsule  Take 1 g by mouth daily.     multivitamin with minerals tablet  Take 1 tablet by mouth daily.     naproxen sodium 220 MG tablet  Commonly known as:  ANAPROX  Take 220 mg by mouth daily as needed (pain).     omeprazole 20 MG capsule  Commonly known as:  PRILOSEC  Take 20 mg by mouth daily.     ondansetron 4 MG tablet  Commonly known as:  ZOFRAN  Take 1  tablet (4 mg total) by mouth every 6 (six) hours as needed for nausea.

## 2014-11-22 NOTE — Progress Notes (Signed)
UR completed 

## 2014-12-08 ENCOUNTER — Ambulatory Visit (HOSPITAL_COMMUNITY): Payer: Commercial Managed Care - HMO | Attending: Cardiology

## 2014-12-08 ENCOUNTER — Other Ambulatory Visit (HOSPITAL_COMMUNITY): Payer: Self-pay | Admitting: Internal Medicine

## 2014-12-08 DIAGNOSIS — I447 Left bundle-branch block, unspecified: Secondary | ICD-10-CM | POA: Insufficient documentation

## 2014-12-08 NOTE — Progress Notes (Signed)
2D Echo completed. 12/08/2014 

## 2015-03-31 ENCOUNTER — Other Ambulatory Visit: Payer: Self-pay | Admitting: Obstetrics & Gynecology

## 2015-04-03 NOTE — Telephone Encounter (Signed)
Medication refill request: Xanax  Last AEX:  09-13-14  Next AEX: 11-09-15 Last MMG (if hormonal medication request): 07-04-14 WNL Refill authorized: please advise

## 2015-04-28 ENCOUNTER — Encounter: Payer: Self-pay | Admitting: Gastroenterology

## 2015-04-28 ENCOUNTER — Encounter: Payer: Self-pay | Admitting: Internal Medicine

## 2015-05-03 ENCOUNTER — Encounter: Payer: Self-pay | Admitting: Gastroenterology

## 2015-05-03 ENCOUNTER — Encounter: Payer: Self-pay | Admitting: Internal Medicine

## 2015-06-19 ENCOUNTER — Other Ambulatory Visit: Payer: Self-pay

## 2015-06-19 DIAGNOSIS — Z1231 Encounter for screening mammogram for malignant neoplasm of breast: Secondary | ICD-10-CM

## 2015-07-10 ENCOUNTER — Ambulatory Visit: Payer: Commercial Managed Care - HMO

## 2015-07-24 ENCOUNTER — Encounter: Payer: Self-pay | Admitting: Obstetrics & Gynecology

## 2015-07-24 ENCOUNTER — Telehealth: Payer: Self-pay | Admitting: Obstetrics & Gynecology

## 2015-07-24 ENCOUNTER — Ambulatory Visit (INDEPENDENT_AMBULATORY_CARE_PROVIDER_SITE_OTHER): Payer: Commercial Managed Care - HMO | Admitting: Obstetrics & Gynecology

## 2015-07-24 VITALS — BP 124/80 | HR 76 | Temp 98.1°F | Resp 20 | Ht 63.25 in | Wt 141.0 lb

## 2015-07-24 DIAGNOSIS — L292 Pruritus vulvae: Secondary | ICD-10-CM | POA: Diagnosis not present

## 2015-07-24 DIAGNOSIS — B373 Candidiasis of vulva and vagina: Secondary | ICD-10-CM | POA: Diagnosis not present

## 2015-07-24 DIAGNOSIS — I454 Nonspecific intraventricular block: Secondary | ICD-10-CM

## 2015-07-24 DIAGNOSIS — B3731 Acute candidiasis of vulva and vagina: Secondary | ICD-10-CM

## 2015-07-24 DIAGNOSIS — R300 Dysuria: Secondary | ICD-10-CM

## 2015-07-24 LAB — POCT URINALYSIS DIPSTICK
Bilirubin, UA: NEGATIVE
Glucose, UA: NEGATIVE
Ketones, UA: NEGATIVE
Leukocytes, UA: NEGATIVE
Nitrite, UA: NEGATIVE
Protein, UA: NEGATIVE
RBC UA: NEGATIVE
Urobilinogen, UA: NEGATIVE
pH, UA: 5

## 2015-07-24 MED ORDER — FLUCONAZOLE 150 MG PO TABS: ORAL_TABLET | ORAL | Status: DC

## 2015-07-24 NOTE — Telephone Encounter (Signed)
Patient says her medical doctor was going to start giving her her prolia injections. Patient says she has yet to hear from them about this matter. Patient would like to get her prolia injections done at our office instead. Best # to reach patient [phone]

## 2015-07-24 NOTE — Telephone Encounter (Signed)
Call to patient home. Phone rang and rang. No answer, no voicemail.

## 2015-07-24 NOTE — Progress Notes (Signed)
Subjective:     Patient ID: Sylvia Kennedy, female   DOB: 03-12-1941, 74 y.o.   MRN: [id number]  HPI 74 yo G3P3 here for a couple of months worth of vulvar skin burning and burning in the groin.  Pt states this is similar to a year ago when this occurred.  She had negative affirm testing last year which was negative.  Has some mild vaginal discharge.  Pt states she's having a little musty odor.  Has used topical Mycolog without improvement.  Pt brought rx with her and it is from 2014 so advised to discard.  She's also tried some topical clobetasol and that made it worse.  This rx is from 2015.  She is having a little burning with urination but she states the burning is on her skin and not like a urinary tract infection.  Does not leak urine, typically.  No back pain.  No fevers.    Hospitalized in March due to GI issues that came from a virus.  Known hx of L BBB.  Had outpt echocardiogram done which was good.      Husband died in May 21, 2023 so pt has been sad, of course.  Going up to family place in [place], [place].    Review of Systems  All other systems reviewed and are negative.      Objective:   Physical Exam  Constitutional: She is oriented to person, place, and time. She appears well-developed and well-nourished.  Abdominal: Soft. Bowel sounds are normal.  Genitourinary:    No labial fusion. There is no tenderness, lesion or injury on the right labia. There is no tenderness, lesion or injury on the left labia. Vaginal discharge (slighlty whith and mildly thickened discharge) found.  Lymphadenopathy:       Right: No inguinal adenopathy present.       Left: No inguinal adenopathy present.  Neurological: She is alert and oriented to person, place, and time.  Skin: Skin is warm and dry.  Psychiatric: She has a normal mood and affect.       Assessment:     Yeast vaginitis and vulvitis     Plan:     Diflucan 150mg  po x 1, repeat 72 hours if still symptomatic Advised pt to not  use anything else topically and to return if symptoms are not fully resolved.

## 2015-07-25 NOTE — Telephone Encounter (Signed)
Called Dr. Danna Hefty office at [facility] at  [phone]. Left message to advise our office is attempting to coordinate care for her Prolia injections.  Requested copies of Bone Density and recent lab work to our fax and left message to return my call.

## 2015-07-25 NOTE — Telephone Encounter (Signed)
-----   Message from Megan Salon, MD sent at 07/24/2015  3:21 PM EST ----- Regarding: prolia Pt needs to start prolia injctions.  Sees Dr. Dagmar Hait.  They told her they would initiate this.  She's called several times and hasn't heard anything yet.  Can you work on this for her?  Thanks.  MSM

## 2015-07-25 NOTE — Telephone Encounter (Signed)
Received call from Dr. Danna Hefty office from [facility]. She states they are no longer doing prolia injections in the office.  She will fax latest Dexa, labs and office visit.  Records to Dr. Sabra Heck at this time.

## 2015-07-26 NOTE — Telephone Encounter (Signed)
Call to patient home number again. Rang and rang. No voicemail.   Need to confirm insurance. Need to schedule Calcium lab draw.

## 2015-07-27 DIAGNOSIS — I454 Nonspecific intraventricular block: Secondary | ICD-10-CM | POA: Insufficient documentation

## 2015-07-31 NOTE — Telephone Encounter (Signed)
Message left to return call to [place] at [phone].   Need insurance information.  Needs Calcium level.  Prolia precert.

## 2015-08-08 ENCOUNTER — Ambulatory Visit
Admission: RE | Admit: 2015-08-08 | Discharge: 2015-08-08 | Disposition: A | Discharge status: Home / Self Care | Payer: Commercial Managed Care - HMO | Source: Ambulatory Visit

## 2015-08-08 DIAGNOSIS — Z1231 Encounter for screening mammogram for malignant neoplasm of breast: Secondary | ICD-10-CM

## 2015-08-09 ENCOUNTER — Telehealth: Payer: Self-pay | Admitting: Obstetrics & Gynecology

## 2015-08-09 MED ORDER — TERCONAZOLE 0.4 % VA CREA: 1.0000 | TOPICAL_CREAM | Freq: Every day | VAGINAL | Status: DC

## 2015-08-09 NOTE — Telephone Encounter (Signed)
Spoke with patient. Verified insurance card on file is up to date. Lab appointment for calcium level scheduled for 11/28 at 11:40 am.

## 2015-08-09 NOTE — Telephone Encounter (Signed)
Patient was seen 07/24/15 for a yeast infection. Patient was prescribed a pill, patient would like to try the cream. Patient says "I fell better  but it has not gone away completely". Confirmed pharmacy with patient.

## 2015-08-09 NOTE — Telephone Encounter (Signed)
Spoke with patient. Advised of message as seen below from [place]. Patient is agreeable and verbalizes understanding. Rx for Terazol 7 one applicator full qhs x 7 days to pharmacy on file.  Routing to provider for final review. Patient agreeable to disposition. Will close encounter.

## 2015-08-09 NOTE — Telephone Encounter (Signed)
Spoke with patient. Patient states that she was seen on 11/7. Took one Diflucan on 11/7 and another on 11/9. States vaginal itching and irritation decreased, but has not gone away. "I feel like it is a lot better. It has just not completely cleared." Patient is requesting another dose of medication or cream for relief over the holiday weekend. Advised per Dr.Miller's OV note she is not to apply anything topically to the area. Will discuss symptoms with Dr.Miller and recommendations and return call. Patient is agreeable.

## 2015-08-09 NOTE — Telephone Encounter (Signed)
OK to retreat with Terazol 7, one applicator full qhs x 7 days.

## 2015-08-09 NOTE — Telephone Encounter (Signed)
Attempted to reach patient at number provided [phone]. The phone rang and rang with no answer or voicemail. Attempted to reach patient at [phone] but the line was busy. Will try again later.

## 2015-08-14 ENCOUNTER — Other Ambulatory Visit: Payer: Commercial Managed Care - HMO

## 2015-08-14 ENCOUNTER — Other Ambulatory Visit: Payer: Self-pay | Admitting: Obstetrics & Gynecology

## 2015-08-14 DIAGNOSIS — R928 Other abnormal and inconclusive findings on diagnostic imaging of breast: Secondary | ICD-10-CM

## 2015-08-14 NOTE — Telephone Encounter (Signed)
Patient called and cancelled her lab appointment to check her calcium levels for today, 08/14/15. She is requesting to speak with a nurse before rescheduling.

## 2015-08-14 NOTE — Telephone Encounter (Signed)
Left message to call Kaitlyn at [phone]. 

## 2015-08-16 ENCOUNTER — Ambulatory Visit
Admission: RE | Admit: 2015-08-16 | Discharge: 2015-08-16 | Disposition: A | Discharge status: Home / Self Care | Payer: Commercial Managed Care - HMO | Source: Ambulatory Visit | Attending: Obstetrics & Gynecology | Admitting: Obstetrics & Gynecology

## 2015-08-16 ENCOUNTER — Other Ambulatory Visit: Payer: Self-pay | Admitting: Obstetrics & Gynecology

## 2015-08-16 DIAGNOSIS — R921 Mammographic calcification found on diagnostic imaging of breast: Secondary | ICD-10-CM

## 2015-08-16 DIAGNOSIS — R928 Other abnormal and inconclusive findings on diagnostic imaging of breast: Secondary | ICD-10-CM

## 2015-08-18 ENCOUNTER — Ambulatory Visit
Admission: RE | Admit: 2015-08-18 | Discharge: 2015-08-18 | Disposition: A | Discharge status: Home / Self Care | Payer: Commercial Managed Care - HMO | Source: Ambulatory Visit | Attending: Obstetrics & Gynecology | Admitting: Obstetrics & Gynecology

## 2015-08-18 DIAGNOSIS — R921 Mammographic calcification found on diagnostic imaging of breast: Secondary | ICD-10-CM

## 2015-08-28 NOTE — Telephone Encounter (Signed)
Spoke with patient. Patient states that she can not remember the question she had regarding her calcium. Would like to reschedule her lab appointment once she knows if the insurance will cover the injection. Advised I will speak with Karen Chafe, RN who has been working on this and return call with further information. Patient is agreeable.

## 2015-09-01 NOTE — Telephone Encounter (Addendum)
Prior authorization pending for Prolia.

## 2015-09-05 NOTE — Telephone Encounter (Signed)
Attempted coverage for prior authorization for Prolia on [email or web address], no prior authorization required per Covermymeds.   Call to [facility] at this time, Representative name: Oley Balm, no prior authorization. Cost for one syringe is $334.83. Covered at [facility].   Call to patient to give updated insurance benefit information. Patient states she cannot plan for this at this time.  She states that she is changing insurance to [employer] on 09/17/15 and she will call back if she would like to try with pre-certification again.   Routing update to Dr. Sabra Heck and will close telephone encounter.

## 2015-09-13 ENCOUNTER — Other Ambulatory Visit: Payer: Self-pay | Admitting: Obstetrics & Gynecology

## 2015-09-13 NOTE — Telephone Encounter (Signed)
Medication refill request: Xanax 0.25 mg tablet Last AEX:  09/13/2014 MSM Next AEX: 11/09/3015 MSM  Last MMG (if hormonal medication request): NA Refill authorized: 04/04/2015 Xanax #30 tabs 1 Refill (Not to exceed 4 additional fills before 05-17-15)  Today: #30 tabs 1 Refill ? Please advise

## 2015-09-14 NOTE — Telephone Encounter (Signed)
Rx faxed today 

## 2015-09-26 ENCOUNTER — Telehealth: Payer: Self-pay | Admitting: Obstetrics & Gynecology

## 2015-09-26 DIAGNOSIS — Z01812 Encounter for preprocedural laboratory examination: Secondary | ICD-10-CM

## 2015-09-26 NOTE — Telephone Encounter (Signed)
Patient is asking to with Dr.Miller's nurse to help with getting her Prolia shot.

## 2015-09-27 NOTE — Telephone Encounter (Signed)
Spoke with patient.  She is ready to try for authorization again with new insurance plan through [employer].  Obtained new card information and prior authorization completed. Patient reports that she has a history of GERD and Refux and cannot tolerate oral treatment.  Member ID [id number] Group: [id number]  Prior authorization to Dr. Sabra Heck for review and signature.

## 2015-10-02 NOTE — Telephone Encounter (Signed)
Prior authorization signed by Dr. Sabra Heck and faxed to [facility] at [phone] with fax confirmation received.

## 2015-10-05 NOTE — Telephone Encounter (Signed)
Call to [place] has been authorized through 09/15/16.   Test claim was processed for $170.00 for 180 day injection.   Patient notified and advised of coverage information, advised may change but that is the information that was provided by [employer] and [facility]. Patient agreeable.   Patient coming on Monday 10/09/15 for Calcium lab draw before Prolia is ordered.

## 2015-10-09 ENCOUNTER — Telehealth: Payer: Self-pay | Admitting: Obstetrics & Gynecology

## 2015-10-09 ENCOUNTER — Ambulatory Visit (INDEPENDENT_AMBULATORY_CARE_PROVIDER_SITE_OTHER): Payer: PPO

## 2015-10-09 DIAGNOSIS — Z01812 Encounter for preprocedural laboratory examination: Secondary | ICD-10-CM | POA: Diagnosis not present

## 2015-10-09 LAB — CALCIUM: Calcium: 9.2 mg/dL (ref 8.6–10.4)

## 2015-10-09 MED ORDER — DENOSUMAB 60 MG/ML ~~LOC~~ SOLN: 60.0000 mg | Freq: Once | SUBCUTANEOUS | Status: DC

## 2015-10-09 MED ORDER — DENOSUMAB 60 MG/ML ~~LOC~~ SOLN: 60.0000 mg | SUBCUTANEOUS | Status: DC

## 2015-10-09 NOTE — Telephone Encounter (Addendum)
Left message on voicemail to reschedule missed lab appointment. Please disregard message. Patient though appointment was at 9:30.

## 2015-10-10 NOTE — Telephone Encounter (Signed)
Order faxed to [facility] for Prolia [phone] with fax confirmation received.

## 2015-10-12 NOTE — Telephone Encounter (Signed)
Called patient and she is advised that Calcium level is normal, okay for Prolia per Dr. Sabra Heck.   Call to [facility] at [phone], spoke with Tiffany. She confirms order was received. They need allergies and ICD -10. M81.0 given for ICD-10 and allergies as in chart.  They will process order and call patient when available.

## 2015-10-12 NOTE — Telephone Encounter (Signed)
-----   Message from Megan Salon, MD sent at 10/12/2015  6:53 AM EST ----- This is normal. Ok to proceed with prolia.

## 2015-10-17 NOTE — Telephone Encounter (Signed)
Called [facility] at [phone]. Spoke with Representative, Legrand Como.  He stated order is ready to be shipped after patient authorizes shipment. They will contact patient to obtain authorization.

## 2015-11-09 ENCOUNTER — Ambulatory Visit (INDEPENDENT_AMBULATORY_CARE_PROVIDER_SITE_OTHER): Payer: PPO | Admitting: Obstetrics & Gynecology

## 2015-11-09 ENCOUNTER — Encounter: Payer: Self-pay | Admitting: Obstetrics & Gynecology

## 2015-11-09 VITALS — BP 140/78 | HR 62 | Resp 18 | Ht 62.25 in | Wt 141.0 lb

## 2015-11-09 DIAGNOSIS — Z01419 Encounter for gynecological examination (general) (routine) without abnormal findings: Secondary | ICD-10-CM | POA: Diagnosis not present

## 2015-11-09 MED ORDER — ESTRADIOL 0.1 MG/GM VA CREA: TOPICAL_CREAM | VAGINAL | Status: DC

## 2015-11-09 MED ORDER — ALPRAZOLAM 0.25 MG PO TABS
0.2500 mg | ORAL_TABLET | Freq: Every evening | ORAL | Status: DC | PRN
Start: 2015-11-09 — End: 2016-06-10

## 2015-11-09 NOTE — Telephone Encounter (Signed)
Patient returning your call [phone].

## 2015-11-09 NOTE — Telephone Encounter (Signed)
Call to pharmacy and representative stated patient declined shipment at this time due to side effects of medication after speaking with the pharmacy.  Call to patient and line and rang unable to leave message.

## 2015-11-09 NOTE — Progress Notes (Signed)
75 y.o. G3P3 WidowedCaucasianF here for annual exam.  Doing well.  Had biopsy in December on breast.  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    Denies vaginal bleeding.   Pt hospitalized 3/16 due to LBBB.  Was admitted overnight.  Follow-up echo was good.  No additional evaluation or follow up was recommended.    Pt is anxious about having a Prolia injection.  Pt is very sensitive to medication and is anxious about starting this due to her sister having side effects with this.  She would like to repeat the BMD next year and see if it is any better.  Pt is doing increased weight bearing exercise almost every day.   Son is doing well.  Last scan was good.  Being seen every three months.  PCP:  Dr. Dagmar Hait.  Has appt next month.    Patient's last menstrual period was 09/16/1993.          Sexually active: No.  The current method of family planning is status post hysterectomy.    Exercising: Yes.    yard and garden work  Smoker:  no  Health Maintenance: Pap:  09/13/14 Neg.  History of abnormal Pap:  no MMG: 08/18/15 Bx right: fibrocystic changes  Colonoscopy:  03/21/2014 w/ Dr. Cristina Gong  BMD:  11/29/14, osteoporosis.  -2.7 t score in hip.   TDaP: PCP Zoster:  Pt declines Pneumonia:  Pt has completed both of these Screening Labs: PCP, Hb today: PCP, Urine today: PCP   reports that she has never smoked. She has never used smokeless tobacco. She reports that she does not drink alcohol or use illicit drugs.  Past Medical History  Diagnosis Date  . Clostridium difficile diarrhea   . Osteopenia     may be starting the prolia injection/in between osteopenia and osteoporosis  . LBBB (left bundle branch block)     02/2014    Past Surgical History  Procedure Laterality Date  . Abdominal hysterectomy      TAH/BSO  . Appendectomy    . Tubal ligation      Family History  Problem Relation Age of Onset  . Breast cancer Maternal Aunt 60  . Heart disease Maternal Grandmother   .  Heart attack Paternal Grandmother   . Thyroid disease Mother   . Heart attack Brother   . Hodgkin's lymphoma Son 45    ROS:  Pertinent items are noted in HPI.  Otherwise, a comprehensive ROS was negative.  Exam:   BP 140/78 mmHg  Pulse 62  Resp 18  Ht 5' 2.25" (1.581 m)  Wt 141 lb (63.957 kg)  BMI 25.59 kg/m2  LMP 09/16/1993  Weight change: stable   Height: 5' 2.25" (158.1 cm)  Ht Readings from Last 3 Encounters:  11/09/15 5' 2.25" (1.581 m)  07/24/15 5' 3.25" (1.607 m)  11/21/14 5\' 3"  (1.6 m)    General appearance: alert, cooperative and appears stated age Head: Normocephalic, without obvious abnormality, atraumatic Neck: no adenopathy, supple, symmetrical, trachea midline and thyroid normal to inspection and palpation Lungs: clear to auscultation bilaterally Breasts: normal appearance, no masses or tenderness Heart: regular rate and rhythm Abdomen: soft, non-tender; bowel sounds normal; no masses,  no organomegaly Extremities: extremities normal, atraumatic, no cyanosis or edema Skin: Skin color, texture, turgor normal. No rashes or lesions Lymph nodes: Cervical, supraclavicular, and axillary nodes normal. No abnormal inguinal nodes palpated Neurologic: Grossly normal   Pelvic: External genitalia:  no lesions  Urethra:  normal appearing urethra with no masses, tenderness or lesions              Bartholins and Skenes: normal                 Vagina: normal appearing vagina with normal color and discharge, no lesions              Cervix: absent              Pap taken: No. Bimanual Exam:  Uterus:  uterus absent              Adnexa: no mass, fullness, tenderness               Rectovaginal: Confirms               Anus:  normal sphincter tone, no lesions  Chaperone was present for exam.  A:  Well Woman, normal exam H/o C. Diff.  No recent issues.  Dr. Collene Mares following.   S/P TAH/BSO Osteoporosis.  Declined treatment for now. Recurrent vaginal dryness that I  feel is atrophic changes and pt feels is yeast  P: Mammogram yearly pap smear obtained today 12/15.  No pap today. Rx for Estrace cream sent to pharmacy.  Pt uses this twice weekly. Labs with Dr. Dagmar Hait Xanax 0.25mg  nightly.  Pt uses 1/2 tab nightly.  #30/3RF.  Will need RF in six month. Plan BMD next year and reconsider treatment if needed. return annually or prn

## 2015-11-09 NOTE — Telephone Encounter (Signed)
Call to patient and she has discussed with Dr. Sabra Heck today and will hold off on treatment for osteoporosis at this time. Will close encounter.

## 2015-11-15 ENCOUNTER — Other Ambulatory Visit: Payer: Self-pay | Admitting: Obstetrics & Gynecology

## 2015-11-29 DIAGNOSIS — N390 Urinary tract infection, site not specified: Secondary | ICD-10-CM | POA: Diagnosis not present

## 2015-11-29 DIAGNOSIS — M810 Age-related osteoporosis without current pathological fracture: Secondary | ICD-10-CM | POA: Diagnosis not present

## 2015-11-29 DIAGNOSIS — E784 Other hyperlipidemia: Secondary | ICD-10-CM | POA: Diagnosis not present

## 2015-11-29 DIAGNOSIS — R8299 Other abnormal findings in urine: Secondary | ICD-10-CM | POA: Diagnosis not present

## 2015-11-29 DIAGNOSIS — E038 Other specified hypothyroidism: Secondary | ICD-10-CM | POA: Diagnosis not present

## 2015-12-06 DIAGNOSIS — F418 Other specified anxiety disorders: Secondary | ICD-10-CM | POA: Diagnosis not present

## 2015-12-06 DIAGNOSIS — Z6825 Body mass index (BMI) 25.0-25.9, adult: Secondary | ICD-10-CM | POA: Diagnosis not present

## 2015-12-06 DIAGNOSIS — K589 Irritable bowel syndrome without diarrhea: Secondary | ICD-10-CM | POA: Diagnosis not present

## 2015-12-06 DIAGNOSIS — J3089 Other allergic rhinitis: Secondary | ICD-10-CM | POA: Diagnosis not present

## 2015-12-06 DIAGNOSIS — E784 Other hyperlipidemia: Secondary | ICD-10-CM | POA: Diagnosis not present

## 2015-12-06 DIAGNOSIS — M810 Age-related osteoporosis without current pathological fracture: Secondary | ICD-10-CM | POA: Diagnosis not present

## 2015-12-06 DIAGNOSIS — M1990 Unspecified osteoarthritis, unspecified site: Secondary | ICD-10-CM | POA: Diagnosis not present

## 2015-12-06 DIAGNOSIS — Z23 Encounter for immunization: Secondary | ICD-10-CM | POA: Diagnosis not present

## 2015-12-06 DIAGNOSIS — K219 Gastro-esophageal reflux disease without esophagitis: Secondary | ICD-10-CM | POA: Diagnosis not present

## 2015-12-06 DIAGNOSIS — Z1389 Encounter for screening for other disorder: Secondary | ICD-10-CM | POA: Diagnosis not present

## 2015-12-06 DIAGNOSIS — E038 Other specified hypothyroidism: Secondary | ICD-10-CM | POA: Diagnosis not present

## 2015-12-06 DIAGNOSIS — Z0000 Encounter for general adult medical examination without abnormal findings: Secondary | ICD-10-CM | POA: Diagnosis not present

## 2015-12-06 DIAGNOSIS — I447 Left bundle-branch block, unspecified: Secondary | ICD-10-CM | POA: Diagnosis not present

## 2015-12-20 ENCOUNTER — Telehealth: Payer: Self-pay

## 2015-12-20 DIAGNOSIS — X32XXXD Exposure to sunlight, subsequent encounter: Secondary | ICD-10-CM | POA: Diagnosis not present

## 2015-12-20 DIAGNOSIS — L570 Actinic keratosis: Secondary | ICD-10-CM | POA: Diagnosis not present

## 2015-12-20 DIAGNOSIS — Z1283 Encounter for screening for malignant neoplasm of skin: Secondary | ICD-10-CM | POA: Diagnosis not present

## 2015-12-20 DIAGNOSIS — D225 Melanocytic nevi of trunk: Secondary | ICD-10-CM | POA: Diagnosis not present

## 2015-12-20 NOTE — Telephone Encounter (Signed)
Spoke with Tammy from [facility]. Tammy states they have been trying to reach this patient to authorize shipment of her Prolia, but have not received a return call. Advised Tammy that per patient at her last OV she did not wish to continue with the Prolia at this time (see note below). Tammy will discontinue her Prolia order at this time.  A: Well Woman, normal exam H/o C. Diff. No recent issues. Dr. Collene Mares following.  S/P TAH/BSO Osteoporosis. Declined treatment for now. Recurrent vaginal dryness that I feel is atrophic changes and pt feels is yeast  Routing to provider for final review. Patient agreeable to disposition. Will close encounter.

## 2016-02-13 ENCOUNTER — Other Ambulatory Visit: Payer: Self-pay | Admitting: Internal Medicine

## 2016-02-13 DIAGNOSIS — E040 Nontoxic diffuse goiter: Secondary | ICD-10-CM | POA: Diagnosis not present

## 2016-02-13 DIAGNOSIS — F458 Other somatoform disorders: Secondary | ICD-10-CM | POA: Diagnosis not present

## 2016-02-13 DIAGNOSIS — E049 Nontoxic goiter, unspecified: Secondary | ICD-10-CM

## 2016-02-13 DIAGNOSIS — Z6825 Body mass index (BMI) 25.0-25.9, adult: Secondary | ICD-10-CM | POA: Diagnosis not present

## 2016-02-13 DIAGNOSIS — J301 Allergic rhinitis due to pollen: Secondary | ICD-10-CM | POA: Diagnosis not present

## 2016-02-13 DIAGNOSIS — E039 Hypothyroidism, unspecified: Secondary | ICD-10-CM | POA: Diagnosis not present

## 2016-02-13 DIAGNOSIS — R0989 Other specified symptoms and signs involving the circulatory and respiratory systems: Secondary | ICD-10-CM

## 2016-02-20 ENCOUNTER — Ambulatory Visit
Admission: RE | Admit: 2016-02-20 | Discharge: 2016-02-20 | Disposition: A | Discharge status: Home / Self Care | Payer: PPO | Source: Ambulatory Visit | Attending: Internal Medicine | Admitting: Internal Medicine

## 2016-02-20 DIAGNOSIS — E049 Nontoxic goiter, unspecified: Secondary | ICD-10-CM | POA: Diagnosis not present

## 2016-02-20 DIAGNOSIS — R0989 Other specified symptoms and signs involving the circulatory and respiratory systems: Secondary | ICD-10-CM

## 2016-02-29 ENCOUNTER — Telehealth: Payer: Self-pay | Admitting: Obstetrics & Gynecology

## 2016-02-29 NOTE — Telephone Encounter (Signed)
Patient wants to talk with the nurse about Prolia.

## 2016-02-29 NOTE — Telephone Encounter (Signed)
Spoke with patient. Patient states that she has decided she would like to proceed with Prolia injection at this time. Advised patient she will need another calcium level drawn at this time as her last calcium was drawn on 10/09/2015. Patient is agreeable. Lab appointment scheduled for 03/04/2016 at 10:30 am. Advised as long as her calcium level is normal we can move forward with her Prolia. She is agreeable and verbalizes understanding. Per telephone note from 09/26/2015 Prolia authorization has been approved from 09/15/2016.   Routing to [place] as Juluis Rainier.

## 2016-03-04 ENCOUNTER — Other Ambulatory Visit (INDEPENDENT_AMBULATORY_CARE_PROVIDER_SITE_OTHER): Payer: PPO

## 2016-03-04 DIAGNOSIS — M8580 Other specified disorders of bone density and structure, unspecified site: Secondary | ICD-10-CM | POA: Diagnosis not present

## 2016-03-04 DIAGNOSIS — Z01812 Encounter for preprocedural laboratory examination: Secondary | ICD-10-CM

## 2016-03-04 LAB — CALCIUM: CALCIUM: 9.1 mg/dL (ref 8.6–10.4)

## 2016-05-21 DIAGNOSIS — K219 Gastro-esophageal reflux disease without esophagitis: Secondary | ICD-10-CM | POA: Diagnosis not present

## 2016-05-21 DIAGNOSIS — K449 Diaphragmatic hernia without obstruction or gangrene: Secondary | ICD-10-CM | POA: Diagnosis not present

## 2016-05-21 DIAGNOSIS — M542 Cervicalgia: Secondary | ICD-10-CM | POA: Diagnosis not present

## 2016-05-21 DIAGNOSIS — K760 Fatty (change of) liver, not elsewhere classified: Secondary | ICD-10-CM | POA: Diagnosis not present

## 2016-05-30 DIAGNOSIS — K219 Gastro-esophageal reflux disease without esophagitis: Secondary | ICD-10-CM | POA: Insufficient documentation

## 2016-06-10 ENCOUNTER — Other Ambulatory Visit: Payer: Self-pay | Admitting: Obstetrics & Gynecology

## 2016-06-10 NOTE — Telephone Encounter (Signed)
Prescription for xanax 0.25mg  [facility] [address]. Fax # [phone].

## 2016-06-10 NOTE — Telephone Encounter (Signed)
Medication refill request: Xanax  Last AEX:  11-09-15  Next AEX: 02-25-17  Last MMG (if hormonal medication request): 08-09-15 Abnormal- had breast BX- WNL Refill authorized: please advise

## 2016-06-13 DIAGNOSIS — S63502A Unspecified sprain of left wrist, initial encounter: Secondary | ICD-10-CM | POA: Diagnosis not present

## 2016-07-03 DIAGNOSIS — R0781 Pleurodynia: Secondary | ICD-10-CM | POA: Diagnosis not present

## 2016-07-03 DIAGNOSIS — Z23 Encounter for immunization: Secondary | ICD-10-CM | POA: Diagnosis not present

## 2016-07-03 DIAGNOSIS — W108XXA Fall (on) (from) other stairs and steps, initial encounter: Secondary | ICD-10-CM | POA: Diagnosis not present

## 2016-07-03 DIAGNOSIS — M79605 Pain in left leg: Secondary | ICD-10-CM | POA: Diagnosis not present

## 2016-07-25 ENCOUNTER — Other Ambulatory Visit: Payer: Self-pay | Admitting: Obstetrics & Gynecology

## 2016-07-25 DIAGNOSIS — Z1231 Encounter for screening mammogram for malignant neoplasm of breast: Secondary | ICD-10-CM

## 2016-07-29 ENCOUNTER — Telehealth: Payer: Self-pay | Admitting: Obstetrics & Gynecology

## 2016-07-29 MED ORDER — ALPRAZOLAM 0.25 MG PO TABS: 0.2500 mg | ORAL_TABLET | Freq: Every evening | ORAL | 0 refills | Status: DC | PRN

## 2016-07-29 NOTE — Telephone Encounter (Signed)
Medication refill request: Alprazolam 0.25mg  Last AEX:  11/09/15 SM Next AEX: 02/25/17 Last MMG (if hormonal medication request): Scheduled for 09/02/16 Refill authorized: please advise in the absence of Dr. Sabra Heck. Last filled 06/10/16 #30 w/0 refills

## 2016-07-29 NOTE — Telephone Encounter (Signed)
Med refill request: Alprazolam 0.25mg   Last AEX: 11/09/15 Next AEX: 02/25/17 Last MMG (if hormonal med) Refill authorized: Please Advise? Last filled 06/10/16-mm #30/0RF

## 2016-07-29 NOTE — Telephone Encounter (Signed)
Patient would like a 3 month supply of alprazolam sent to her pharmacy [facility] on [address] at [phone].

## 2016-08-01 NOTE — Telephone Encounter (Signed)
Patient is calling to find out why she did not get a 90 day supply for Alprazolam. She went to the pharmacy and was only given a 30 day supply.

## 2016-08-02 NOTE — Telephone Encounter (Signed)
Called patient, explained that Xanax is a controlled substance. Provider only refilled #30, as it was done the same way with the previous refill. Patient showed understanding. Closing this encounter.

## 2016-08-06 DIAGNOSIS — H5203 Hypermetropia, bilateral: Secondary | ICD-10-CM | POA: Diagnosis not present

## 2016-08-06 DIAGNOSIS — H524 Presbyopia: Secondary | ICD-10-CM | POA: Diagnosis not present

## 2016-08-06 DIAGNOSIS — H2513 Age-related nuclear cataract, bilateral: Secondary | ICD-10-CM | POA: Diagnosis not present

## 2016-08-06 DIAGNOSIS — H25013 Cortical age-related cataract, bilateral: Secondary | ICD-10-CM | POA: Diagnosis not present

## 2016-08-19 ENCOUNTER — Telehealth: Payer: Self-pay | Admitting: Obstetrics & Gynecology

## 2016-08-19 ENCOUNTER — Encounter: Payer: Self-pay | Admitting: Obstetrics & Gynecology

## 2016-08-19 ENCOUNTER — Ambulatory Visit (INDEPENDENT_AMBULATORY_CARE_PROVIDER_SITE_OTHER): Payer: PPO | Admitting: Obstetrics & Gynecology

## 2016-08-19 VITALS — BP 132/60 | HR 64 | Resp 14 | Ht 62.5 in | Wt 145.6 lb

## 2016-08-19 DIAGNOSIS — B373 Candidiasis of vulva and vagina: Secondary | ICD-10-CM | POA: Diagnosis not present

## 2016-08-19 DIAGNOSIS — B3731 Acute candidiasis of vulva and vagina: Secondary | ICD-10-CM

## 2016-08-19 MED ORDER — FLUCONAZOLE 150 MG PO TABS: ORAL_TABLET | ORAL | 0 refills | Status: DC

## 2016-08-19 NOTE — Progress Notes (Signed)
GYNECOLOGY  VISIT   HPI: 75 y.o. G3P3 Widowed Caucasian female with complaint of increased vaginal itching and discharge.  This has been going on for several weeks as she's  Has used some left over terazol cream.  She was alternating this with premarin but the premarin seems to irritate her.    Denies any urinary issues.  Reports that she is having more issues with getting herself clean after a BM.  No diarrhea and stool is usually formed.  Denies constipation.    Denies vaginal.  Patient's last menstrual period was 09/16/1993.    GYNECOLOGIC HISTORY: Patient's last menstrual period was 09/16/1993. Contraception: PMP  Patient Active Problem List   Diagnosis Date Noted  . BBB (bundle branch block) 07/27/2015  . Gastroenteritis 11/21/2014  . Paresthesia 03/25/2014  . Neck pain 02/14/2014    Past Medical History:  Diagnosis Date  . Clostridium difficile diarrhea   . LBBB (left bundle branch block)    02/2014  . Osteopenia    may be starting the prolia injection/in between osteopenia and osteoporosis    Past Surgical History:  Procedure Laterality Date  . ABDOMINAL HYSTERECTOMY     TAH/BSO  . APPENDECTOMY    . TUBAL LIGATION      MEDS:  Reviewed in EPIC and UTD  ALLERGIES: Ciprofloxacin; Darvon; and Mtx support [cobalamine combinations]  Family History  Problem Relation Age of Onset  . Breast cancer Maternal Aunt 60  . Heart disease Maternal Grandmother   . Heart attack Paternal Grandmother   . Thyroid disease Mother   . Heart attack Brother   . Hodgkin's lymphoma Son 47    SH:  Widowed, non smoker  Review of Systems  All other systems reviewed and are negative.   PHYSICAL EXAMINATION:    BP 132/60 (BP Location: Right Arm, Patient Position: Sitting, Cuff Size: Normal)   Pulse 64   Resp 14   Ht 5' 2.5" (1.588 m)   Wt 145 lb 9.6 oz (66 kg)   LMP 09/16/1993   BMI 26.21 kg/m     General appearance: alert, cooperative and appears stated age  Pelvic:  External genitalia:  no lesions              Urethra:  normal appearing urethra with no masses, tenderness or lesions              Bartholins and Skenes: normal                 Vagina: normal appearing vagina with normal color and no lesions, +dishcarge note              Cervix: no lesions              Bimanual Exam:  Uterus:  uterus absent              Adnexa: no mass, fullness, tenderness    Chaperone was present for exam.  Assessment: Vaginal discharge  Plan: Diflucan 150mg  po x 1, repeat for 2 more doses Pt knows to call if symptoms do not resolve

## 2016-08-19 NOTE — Telephone Encounter (Signed)
Spoke with patient. Patient states that she has been experiencing vaginal burning, yellow discharge with odor and intermittent lower pelvic discomfort. Has been applying Terazol cream externally for a few days with slight relief. Has not placed any cream internally. Also using Estrace cream which she reports is causing her increased irritation. Advised she will need to be seen in the office for further evaluation. Patient is agreeable. Appointment scheduled for 08/19/2016 at 2 pm with Dr.Miller. Patient is agreeable to date and time.  Routing to provider for final review. Patient agreeable to disposition. Will close encounter.

## 2016-08-19 NOTE — Telephone Encounter (Signed)
Patient states she thinks she may have a yeast infection and wants a script called into the pharmacy.

## 2016-08-26 ENCOUNTER — Encounter: Payer: Self-pay | Admitting: Obstetrics & Gynecology

## 2016-08-26 ENCOUNTER — Ambulatory Visit (INDEPENDENT_AMBULATORY_CARE_PROVIDER_SITE_OTHER): Payer: PPO | Admitting: Obstetrics & Gynecology

## 2016-08-26 VITALS — BP 140/80 | HR 78 | Temp 98.0°F | Resp 16 | Ht 62.5 in | Wt 143.0 lb

## 2016-08-26 DIAGNOSIS — R3915 Urgency of urination: Secondary | ICD-10-CM

## 2016-08-26 DIAGNOSIS — K7689 Other specified diseases of liver: Secondary | ICD-10-CM | POA: Diagnosis not present

## 2016-08-26 DIAGNOSIS — R945 Abnormal results of liver function studies: Secondary | ICD-10-CM

## 2016-08-26 DIAGNOSIS — N898 Other specified noninflammatory disorders of vagina: Secondary | ICD-10-CM | POA: Diagnosis not present

## 2016-08-26 DIAGNOSIS — N760 Acute vaginitis: Secondary | ICD-10-CM | POA: Diagnosis not present

## 2016-08-26 LAB — POCT URINALYSIS DIPSTICK
Bilirubin, UA: NEGATIVE
Blood, UA: NEGATIVE
Glucose, UA: NEGATIVE
KETONES UA: NEGATIVE
Nitrite, UA: NEGATIVE
PH UA: 5
PROTEIN UA: NEGATIVE
Urobilinogen, UA: NEGATIVE

## 2016-08-26 NOTE — Telephone Encounter (Signed)
Patient is having a lot of gas and pain in her rectum. She was seen last week for vaginal irritation  but is not better yet.

## 2016-08-26 NOTE — Telephone Encounter (Signed)
Spoke with patient. Patient states she was is for OV 12/4 for vaginal discharge. Patient states she was prescribed diflucan and just took pill #3 last night. Patient states vaginal discharge is better. Patient reports a tremendous amount if gas, pain in rectum and lower back above waist on left side. Patient states this pain is new and has never experienced this before. Patient states she feels it may be gas as it gets tight around waist -patient reports passing gas and BM 12/10. Patient states BM was normal in the beginning, looser towards end. Patient states she had noticed an odor but can't tell if it is vaginal or related to BM. Patient states she has noticed urinary frequency but states she has been trying to drink more often with the medication. Patient denies fever or nausea. Recommended OV for further evaluation -patient scheduled for 12/11 at 1pm with Dr. Sabra Heck. Patient is agreeable to date and time.          12/4 OV  Assessment: Vaginal discharge  Plan: Diflucan 150mg  po x 1, repeat for 2 more doses Pt knows to call if symptoms do not resolve

## 2016-08-26 NOTE — Progress Notes (Signed)
GYNECOLOGY  VISIT   HPI: 75 y.o. G3P3 Widowed Caucasian female here with complaint of continued and even increased vaginal discharge.  Pt reports she had increased gas over the weekend.  She took Diflucan last week and she wonders if this was a side effect.  This gas was associated with increased abdominal cramping as well.  She did not have diarrhea associated with this. This seems to have resolved yesterday.  She was worried about it and asked to be seen again.  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  She hasn't had any symptoms like this is several years but remembers it vividly and was concerned this was the beginning of it again.  Again, this seems to have completely resolved.  She did ask if diflucan could cause this.  Reviewed with pt most significant antibiotic causes of this.  Ciprofloxin was what she was taking when the C. Diff colitis occurred.   She denies vaginal bleeding.  She is not having any increased urinary frequency.  Dip u/a is negative here today.  She asked for this to be done.  GYNECOLOGIC HISTORY: Patient's last menstrual period was 09/16/1993. Contraception: PMP Menopausal hormone therapy: none  Patient Active Problem List   Diagnosis Date Noted  . BBB (bundle branch block) 07/27/2015  . Gastroenteritis 11/21/2014  . Paresthesia 03/25/2014  . Neck pain 02/14/2014    Past Medical History:  Diagnosis Date  . Clostridium difficile diarrhea   . LBBB (left bundle branch block)    02/2014  . Osteopenia    may be starting the prolia injection/in between osteopenia and osteoporosis    Past Surgical History:  Procedure Laterality Date  . ABDOMINAL HYSTERECTOMY     TAH/BSO  . APPENDECTOMY    . TUBAL LIGATION      MEDS:  Reviewed in EPIC and UTD  ALLERGIES: Ciprofloxacin; Darvon; and Mtx support [cobalamine combinations]  Family History  Problem Relation Age of Onset  . Breast cancer Maternal Aunt 60  . Heart disease Maternal Grandmother    . Heart attack Paternal Grandmother   . Thyroid disease Mother   . Heart attack Brother   . Hodgkin's lymphoma Son 86    SH: married, non smoker  ROS  PHYSICAL EXAMINATION:    BP 140/80 (BP Location: Right Arm, Patient Position: Sitting, Cuff Size: Normal)   Pulse 78   Temp 98 F (36.7 C) (Oral)   Resp 16   Ht 5' 2.5" (1.588 m)   Wt 143 lb (64.9 kg)   LMP 09/16/1993   BMI 25.74 kg/m     General appearance: alert, cooperative and appears stated age Abdomen: soft, non-tender; bowel sounds normal; no masses,  no organomegaly  Pelvic: External genitalia:  no lesions              Urethra:  normal appearing urethra with no masses, tenderness or lesions              Bartholins and Skenes: normal                 Vagina: normal appearing vagina with normal color and no significant discharge noted.  No lesions              Cervix: absent              Bimanual Exam:  Uterus:  uterus absent              Adnexa: no mass, fullness, tenderness  Rectovaginal: No.              Anus:  No visible lesions  Chaperone was present for exam.  Assessment: Vaginal discharge Recent abdominal pain, bloating, increased gas that seems to have resolved H/O elevated liver enzymes earlier this year   Plan: Affirm pending.  Treatment will be based on these results Liver function tests will be obtained today.

## 2016-08-27 LAB — WET PREP BY MOLECULAR PROBE
Candida species: NEGATIVE
GARDNERELLA VAGINALIS: NEGATIVE
TRICHOMONAS VAG: NEGATIVE

## 2016-08-27 LAB — HEPATIC FUNCTION PANEL
ALT: 18 U/L (ref 6–29)
AST: 31 U/L (ref 10–35)
Albumin: 4.3 g/dL (ref 3.6–5.1)
Alkaline Phosphatase: 73 U/L (ref 33–130)
BILIRUBIN DIRECT: 0.2 mg/dL (ref <=0.2)
BILIRUBIN INDIRECT: 1 mg/dL (ref 0.2–1.2)
BILIRUBIN TOTAL: 1.2 mg/dL (ref 0.2–1.2)
TOTAL PROTEIN: 7 g/dL (ref 6.1–8.1)

## 2016-08-28 ENCOUNTER — Telehealth: Payer: Self-pay

## 2016-08-28 ENCOUNTER — Encounter: Payer: Self-pay | Admitting: Obstetrics & Gynecology

## 2016-08-28 NOTE — Telephone Encounter (Signed)
Spoke with patient. Advised of message and results as seen below from [place]. Patient is agreeable. States she is still having a small amount of vaginal discharge. Advised she will need to use coconut oil vaginally twice weekly for 2 months then follow up. Patient is agreeable and will contact the office in 2 months or prior if she has any concerns or questions.  Routing to provider for final review. Patient agreeable to disposition. Will close encounter.

## 2016-08-28 NOTE — Telephone Encounter (Signed)
-----   Message from Megan Salon, MD sent at 08/28/2016  5:54 AM EST ----- Please let pt know her liver function tests were completely normal.  Also, her test for yeast or BV was negative.  There is no treatment that is indicated.  Is she still having symptoms?  If it is vaginal discharge, I want her to use coconut oil vaginally twice weekly for two months and then follow-up.  If it is anything else, please let me know.  Thanks.

## 2016-08-29 DIAGNOSIS — K602 Anal fissure, unspecified: Secondary | ICD-10-CM | POA: Diagnosis not present

## 2016-08-29 DIAGNOSIS — K6289 Other specified diseases of anus and rectum: Secondary | ICD-10-CM | POA: Diagnosis not present

## 2016-08-29 DIAGNOSIS — K219 Gastro-esophageal reflux disease without esophagitis: Secondary | ICD-10-CM | POA: Diagnosis not present

## 2016-08-29 DIAGNOSIS — R140 Abdominal distension (gaseous): Secondary | ICD-10-CM | POA: Diagnosis not present

## 2016-09-02 ENCOUNTER — Ambulatory Visit
Admission: RE | Admit: 2016-09-02 | Discharge: 2016-09-02 | Disposition: A | Discharge status: Home / Self Care | Payer: PPO | Source: Ambulatory Visit | Attending: Obstetrics & Gynecology | Admitting: Obstetrics & Gynecology

## 2016-09-02 DIAGNOSIS — Z1231 Encounter for screening mammogram for malignant neoplasm of breast: Secondary | ICD-10-CM | POA: Diagnosis not present

## 2016-10-08 ENCOUNTER — Other Ambulatory Visit: Payer: Self-pay | Admitting: Obstetrics and Gynecology

## 2016-10-08 DIAGNOSIS — Z6826 Body mass index (BMI) 26.0-26.9, adult: Secondary | ICD-10-CM | POA: Diagnosis not present

## 2016-10-08 DIAGNOSIS — M79605 Pain in left leg: Secondary | ICD-10-CM | POA: Diagnosis not present

## 2016-10-08 NOTE — Telephone Encounter (Signed)
Medication refill request: Alprazolam Last AEX:  11/09/15 SM Next AEX: 11/18/16 SM Last MMG (if hormonal medication request): 09/02/16 BIRADS1, D[facility] Refill authorized: 07/29/16 #30 0R. Please advise. Thank you.

## 2016-10-10 NOTE — Telephone Encounter (Signed)
Prescription for xanax 0.5mg - #30,0RF faxed to [facility] on [address]. Fax #: [phone]) [phone].

## 2016-11-14 NOTE — Progress Notes (Signed)
76 y.o. G3P3 Widowed Caucasian F here for annual exam.  Doing well.  No vaginal bleeding.  Using aquaphor.   Patient's last menstrual period was 09/16/1993.          Sexually active: No.  The current method of family planning is status post hysterectomy.    Exercising: Yes.    floor exercises, yard work  Smoker:  no  Health Maintenance: Pap:  09/13/14 negative  History of abnormal Pap:  no MMG:  09/02/16 BIRADS 1 negative  Colonoscopy:  04/16/2008, Dr. Cristina Gong BMD:   11/29/14 osteoporosis TDaP:  2017 with PCP Pneumonia vaccine(s):  Done with PCP  Zostavax:   never Hep C testing: not indicated  Screening Labs: PCP, Hb today: PCP   reports that she has never smoked. She has never used smokeless tobacco. She reports that she does not drink alcohol or use drugs.  Past Medical History:  Diagnosis Date  . Clostridium difficile diarrhea   . LBBB (left bundle branch block)    02/2014  . Osteopenia    may be starting the prolia injection/in between osteopenia and osteoporosis    Past Surgical History:  Procedure Laterality Date  . ABDOMINAL HYSTERECTOMY     TAH/BSO  . APPENDECTOMY    . TUBAL LIGATION      Current Outpatient Prescriptions  Medication Sig Dispense Refill  . acidophilus (RISAQUAD) CAPS capsule Take 1 capsule by mouth daily.    Marland Kitchen ALPRAZolam (XANAX) 0.25 MG tablet TAKE 1 TABLET BY MOUTH AT BEDTIME AS NEEDED 30 tablet 0  . aspirin EC 81 MG tablet Take by mouth.    . Cholecalciferol (VITAMIN D PO) Take 1-2 tablets by mouth.    . fluticasone (FLONASE) 50 MCG/ACT nasal spray     . Multiple Vitamins-Minerals (MULTIVITAMIN WITH MINERALS) tablet Take 1 tablet by mouth daily.    Marland Kitchen omeprazole (PRILOSEC) 20 MG capsule TAKE ONE CAPSULE EVERY DAY     No current facility-administered medications for this visit.     Family History  Problem Relation Age of Onset  . Thyroid disease Mother   . Breast cancer Maternal Aunt 60  . Heart disease Maternal Grandmother   . Heart attack  Paternal Grandmother   . Heart attack Brother   . Hodgkin's lymphoma Son 57  . Colon cancer Cousin     ROS:  Pertinent items are noted in HPI.  Otherwise, a comprehensive ROS was negative.  Exam:   BP 138/60 (BP Location: Right Arm, Patient Position: Sitting, Cuff Size: Normal)   Pulse 60   Resp 12   Ht 5' 1.75" (1.568 m)   Wt 146 lb 6.4 oz (66.4 kg)   LMP 09/16/1993   BMI 26.99 kg/m    Height: 5' 1.75" (156.8 cm)  Ht Readings from Last 3 Encounters:  11/18/16 5' 1.75" (1.568 m)  08/26/16 5' 2.5" (1.588 m)  08/19/16 5' 2.5" (1.588 m)    General appearance: alert, cooperative and appears stated age Head: Normocephalic, without obvious abnormality, atraumatic Neck: no adenopathy, supple, symmetrical, trachea midline and thyroid normal to inspection and palpation Lungs: clear to auscultation bilaterally Breasts: normal appearance, no masses or tenderness Heart: regular rate and rhythm Abdomen: soft, non-tender; bowel sounds normal; no masses,  no organomegaly Extremities: extremities normal, atraumatic, no cyanosis or edema Skin: Skin color, texture, turgor normal. No rashes or lesions Lymph nodes: Cervical, supraclavicular, and axillary nodes normal. No abnormal inguinal nodes palpated Neurologic: Grossly normal   Pelvic: External genitalia:  no lesions  Urethra:  normal appearing urethra with no masses, tenderness or lesions              Bartholins and Skenes: normal                 Vagina: normal appearing vagina with normal color and discharge, no lesions              Cervix: absent              Pap taken: Yes.   Bimanual Exam:  Uterus:  uterus absent              Adnexa: no mass, fullness, tenderness               Rectovaginal: Confirms               Anus:  normal sphincter tone, no lesions  Chaperone was present for exam.  A:  Well Woman with normal exam H/O Clostridium difficile ~2013 S/P TAH/BSO Osteoporosis Left BBB Vaginal  itching/atrophy  P:   Mammogram guidelines reviewed BMD order placed.  Pt will call to schedule.  If no change, she wants to proceed with Prolia. Calcium level today pap smear obtained today Uses Xanax 1/2 tab of 0.25mg  nightly for anxiety.  Has rx.   return annually or prn

## 2016-11-18 ENCOUNTER — Ambulatory Visit (INDEPENDENT_AMBULATORY_CARE_PROVIDER_SITE_OTHER): Payer: PPO | Admitting: Obstetrics & Gynecology

## 2016-11-18 ENCOUNTER — Other Ambulatory Visit (HOSPITAL_COMMUNITY)
Admission: RE | Admit: 2016-11-18 | Discharge: 2016-11-18 | Disposition: A | Discharge status: Home / Self Care | Payer: PPO | Source: Ambulatory Visit | Attending: Obstetrics & Gynecology | Admitting: Obstetrics & Gynecology

## 2016-11-18 ENCOUNTER — Encounter: Payer: Self-pay | Admitting: Obstetrics & Gynecology

## 2016-11-18 VITALS — BP 138/60 | HR 60 | Resp 12 | Ht 61.75 in | Wt 146.4 lb

## 2016-11-18 DIAGNOSIS — Z79899 Other long term (current) drug therapy: Secondary | ICD-10-CM

## 2016-11-18 DIAGNOSIS — Z124 Encounter for screening for malignant neoplasm of cervix: Secondary | ICD-10-CM | POA: Insufficient documentation

## 2016-11-18 DIAGNOSIS — M810 Age-related osteoporosis without current pathological fracture: Secondary | ICD-10-CM | POA: Diagnosis not present

## 2016-11-18 DIAGNOSIS — Z01419 Encounter for gynecological examination (general) (routine) without abnormal findings: Secondary | ICD-10-CM | POA: Diagnosis not present

## 2016-11-18 LAB — CALCIUM: Calcium: 9.5 mg/dL (ref 8.6–10.4)

## 2016-11-20 LAB — CYTOLOGY - PAP: Diagnosis: NEGATIVE

## 2016-12-02 ENCOUNTER — Other Ambulatory Visit: Payer: Self-pay | Admitting: Obstetrics & Gynecology

## 2016-12-02 DIAGNOSIS — M810 Age-related osteoporosis without current pathological fracture: Secondary | ICD-10-CM | POA: Diagnosis not present

## 2016-12-02 NOTE — Telephone Encounter (Signed)
Medication refill request: xanax  Last AEX:  11/18/16 SM Next AEX: 02/23/18 SM Last MMG (if hormonal medication request): 09/02/16 BIRADS1:neg  Refill authorized: 10/08/16 #30/0R. Today please advise.

## 2016-12-02 NOTE — Telephone Encounter (Signed)
Prescription for xanax 0.25mg , #30,0RF faxed to [facility] on [address]. Fax #: [phone]) [phone].

## 2016-12-18 DIAGNOSIS — M810 Age-related osteoporosis without current pathological fracture: Secondary | ICD-10-CM | POA: Diagnosis not present

## 2016-12-18 DIAGNOSIS — E784 Other hyperlipidemia: Secondary | ICD-10-CM | POA: Diagnosis not present

## 2016-12-18 DIAGNOSIS — Z0000 Encounter for general adult medical examination without abnormal findings: Secondary | ICD-10-CM | POA: Diagnosis not present

## 2016-12-18 DIAGNOSIS — E038 Other specified hypothyroidism: Secondary | ICD-10-CM | POA: Diagnosis not present

## 2016-12-25 DIAGNOSIS — M810 Age-related osteoporosis without current pathological fracture: Secondary | ICD-10-CM | POA: Diagnosis not present

## 2016-12-25 DIAGNOSIS — Z1389 Encounter for screening for other disorder: Secondary | ICD-10-CM | POA: Diagnosis not present

## 2016-12-25 DIAGNOSIS — E040 Nontoxic diffuse goiter: Secondary | ICD-10-CM | POA: Diagnosis not present

## 2016-12-25 DIAGNOSIS — F418 Other specified anxiety disorders: Secondary | ICD-10-CM | POA: Diagnosis not present

## 2016-12-25 DIAGNOSIS — I447 Left bundle-branch block, unspecified: Secondary | ICD-10-CM | POA: Diagnosis not present

## 2016-12-25 DIAGNOSIS — Z0000 Encounter for general adult medical examination without abnormal findings: Secondary | ICD-10-CM | POA: Diagnosis not present

## 2016-12-25 DIAGNOSIS — J3089 Other allergic rhinitis: Secondary | ICD-10-CM | POA: Diagnosis not present

## 2016-12-25 DIAGNOSIS — M1990 Unspecified osteoarthritis, unspecified site: Secondary | ICD-10-CM | POA: Diagnosis not present

## 2016-12-25 DIAGNOSIS — K219 Gastro-esophageal reflux disease without esophagitis: Secondary | ICD-10-CM | POA: Diagnosis not present

## 2016-12-25 DIAGNOSIS — Z6826 Body mass index (BMI) 26.0-26.9, adult: Secondary | ICD-10-CM | POA: Diagnosis not present

## 2016-12-25 DIAGNOSIS — E784 Other hyperlipidemia: Secondary | ICD-10-CM | POA: Diagnosis not present

## 2016-12-25 DIAGNOSIS — K589 Irritable bowel syndrome without diarrhea: Secondary | ICD-10-CM | POA: Diagnosis not present

## 2017-01-02 DIAGNOSIS — Z1212 Encounter for screening for malignant neoplasm of rectum: Secondary | ICD-10-CM | POA: Diagnosis not present

## 2017-01-14 ENCOUNTER — Telehealth: Payer: Self-pay | Admitting: Obstetrics & Gynecology

## 2017-01-14 MED ORDER — DENOSUMAB 60 MG/ML ~~LOC~~ SOLN: 60.0000 mg | Freq: Once | SUBCUTANEOUS | 1 refills | Status: AC

## 2017-01-14 NOTE — Telephone Encounter (Signed)
Spoke with patient. Advised of message from [place]. Patient is agreeable to proceed with Prolia. Advised rx has been sent to [facility] and she will be contacted to discuss cost and shipment. Patient verbalizes understanding.

## 2017-01-14 NOTE — Telephone Encounter (Signed)
Maureen Ralphs, RN        Ken Caryl,   Most recent office visit states to order Prolia based on results from next Bone Density test. Please hold until results received.   Patient has [employer] insurance with [employer] [phone]). No precertification required.   Spoke with representative who stated patient previously cancelled an order for Prolia - which we found to be from last year. He stated a new prescription would need to be entered and they will call the patient with her cost estimate. He states they attempted to offer payment assistance but were unable due to her Medicare Part D restrictions.   We are unable to offer a copay card assistance from the Prolia manufacturer due to her Medicare Part D restrictions.   Thank you,   Becky    Patient's PCP note from 12/25/2016 with Dr.Avva states patient had BMD on 12/02/2016 which shows improvement in left hip, stability in right hip, and worsening of bone density in spine. Consistent with osteoporosis.  Rx for Prolia 60 mg/ml Inject 60 mg into the skin once. Administer in upper arm, thigh, or abdomen #1 1RF sent to [facility]. No PA needed.  Spoke with patient. Patient states that she would like Dr.Miller's opinion regarding proceeding with Prolia before proceeding with shipment of medicine.  Dr.Miller please advise start of Prolia.

## 2017-01-14 NOTE — Telephone Encounter (Signed)
Patient said she is still waiting to here from our office about her Prolia injection.

## 2017-01-14 NOTE — Telephone Encounter (Signed)
I think she should continue.  She's done well with it and she does have risks for fractures.

## 2017-01-20 NOTE — Telephone Encounter (Signed)
Needs to schedule delivery of Prolia for patient

## 2017-01-20 NOTE — Telephone Encounter (Signed)
Spoke with [employer]. Verified office address for shipping of Prolia. Prolia will be delivered on 01/22/2017. Spoke with patient. Appointment for Prolia injection scheduled for 01/23/2017 at 3 pm. Patient is agreeable to date and time.  Cc: Theresia Lo  Routing to provider for final review. Patient agreeable to disposition. Will close encounter.

## 2017-01-23 ENCOUNTER — Ambulatory Visit (INDEPENDENT_AMBULATORY_CARE_PROVIDER_SITE_OTHER): Payer: PPO

## 2017-01-23 VITALS — BP 118/72 | HR 76 | Resp 16 | Wt 143.0 lb

## 2017-01-23 DIAGNOSIS — M810 Age-related osteoporosis without current pathological fracture: Secondary | ICD-10-CM

## 2017-01-23 MED ORDER — DENOSUMAB 60 MG/ML ~~LOC~~ SOLN
60.0000 mg | Freq: Once | SUBCUTANEOUS | Status: AC
  Administered 2017-01-23: 60 mg via SUBCUTANEOUS

## 2017-01-23 NOTE — Progress Notes (Signed)
Patient here for first Prolia injection. Okay to administer per Dr. Sabra Heck. Injection administered Subcutaneous in Left Arm and was tolerated well. Patient waited in office for 10 minutes to ensure that there was no reaction to injection. Patient advised to call office 2 weeks prior to next injection (6 months) to have Calcium levels checked.  Routing to provider for final review. Patient agreeable to disposition. Will close encounter.

## 2017-01-30 DIAGNOSIS — M810 Age-related osteoporosis without current pathological fracture: Secondary | ICD-10-CM

## 2017-02-25 ENCOUNTER — Ambulatory Visit: Payer: PPO | Admitting: Obstetrics & Gynecology

## 2017-04-14 ENCOUNTER — Other Ambulatory Visit: Payer: Self-pay | Admitting: Obstetrics & Gynecology

## 2017-04-14 NOTE — Telephone Encounter (Signed)
Medication refill request: Xanax  Last AEX:  11-18-16  Next AEX: 02-23-18  Last MMG (if hormonal medication request): 09-02-16 WNL  Refill authorized: please advise

## 2017-06-23 DIAGNOSIS — M810 Age-related osteoporosis without current pathological fracture: Secondary | ICD-10-CM | POA: Diagnosis not present

## 2017-06-23 DIAGNOSIS — Z23 Encounter for immunization: Secondary | ICD-10-CM | POA: Diagnosis not present

## 2017-06-23 DIAGNOSIS — F418 Other specified anxiety disorders: Secondary | ICD-10-CM | POA: Diagnosis not present

## 2017-06-23 DIAGNOSIS — E040 Nontoxic diffuse goiter: Secondary | ICD-10-CM | POA: Diagnosis not present

## 2017-06-23 DIAGNOSIS — Z6826 Body mass index (BMI) 26.0-26.9, adult: Secondary | ICD-10-CM | POA: Diagnosis not present

## 2017-06-23 DIAGNOSIS — Z0131 Encounter for examination of blood pressure with abnormal findings: Secondary | ICD-10-CM | POA: Diagnosis not present

## 2017-06-23 DIAGNOSIS — K219 Gastro-esophageal reflux disease without esophagitis: Secondary | ICD-10-CM | POA: Diagnosis not present

## 2017-07-29 DIAGNOSIS — H2513 Age-related nuclear cataract, bilateral: Secondary | ICD-10-CM | POA: Diagnosis not present

## 2017-07-29 DIAGNOSIS — H524 Presbyopia: Secondary | ICD-10-CM | POA: Diagnosis not present

## 2017-07-29 DIAGNOSIS — H0100A Unspecified blepharitis right eye, upper and lower eyelids: Secondary | ICD-10-CM | POA: Diagnosis not present

## 2017-07-29 DIAGNOSIS — H0100B Unspecified blepharitis left eye, upper and lower eyelids: Secondary | ICD-10-CM | POA: Diagnosis not present

## 2017-08-11 ENCOUNTER — Other Ambulatory Visit: Payer: Self-pay | Admitting: Obstetrics & Gynecology

## 2017-08-11 DIAGNOSIS — Z139 Encounter for screening, unspecified: Secondary | ICD-10-CM

## 2017-08-13 ENCOUNTER — Other Ambulatory Visit: Payer: Self-pay | Admitting: Obstetrics & Gynecology

## 2017-08-13 NOTE — Telephone Encounter (Signed)
Medication refill request: Xanax Last AEX:  11/18/16 SM  Next AEX: 02/23/18  Last MMG (if hormonal medication request): 09/02/16 BIRADS 1 negative  Refill authorized: 04/14/17 #30, 1 RF. Today, please advise.

## 2017-08-15 ENCOUNTER — Telehealth: Payer: Self-pay | Admitting: *Deleted

## 2017-08-15 NOTE — Telephone Encounter (Signed)
Detailed message left for patient to return call to office to discuss shipment of Prolia. [facility] unable to reach patient in regards to shipment.

## 2017-08-22 ENCOUNTER — Encounter: Payer: Self-pay | Admitting: Obstetrics & Gynecology

## 2017-08-22 ENCOUNTER — Ambulatory Visit: Payer: PPO | Admitting: Obstetrics & Gynecology

## 2017-08-22 ENCOUNTER — Other Ambulatory Visit: Payer: Self-pay

## 2017-08-22 ENCOUNTER — Telehealth: Payer: Self-pay | Admitting: Obstetrics & Gynecology

## 2017-08-22 VITALS — BP 136/76 | HR 64 | Temp 98.1°F | Resp 14 | Wt 149.0 lb

## 2017-08-22 DIAGNOSIS — N898 Other specified noninflammatory disorders of vagina: Secondary | ICD-10-CM

## 2017-08-22 DIAGNOSIS — R1030 Lower abdominal pain, unspecified: Secondary | ICD-10-CM

## 2017-08-22 LAB — POCT URINALYSIS DIPSTICK
Bilirubin, UA: NEGATIVE
Blood, UA: NEGATIVE
Glucose, UA: NEGATIVE
KETONES UA: NEGATIVE
LEUKOCYTES UA: NEGATIVE
Nitrite, UA: NEGATIVE
PH UA: 5 (ref 5.0–8.0)
PROTEIN UA: NEGATIVE
UROBILINOGEN UA: 0.2 U/dL

## 2017-08-22 MED ORDER — FLUCONAZOLE 150 MG PO TABS: 150.0000 mg | ORAL_TABLET | Freq: Once | ORAL | 0 refills | Status: AC

## 2017-08-22 MED ORDER — NITROFURANTOIN MONOHYD MACRO 100 MG PO CAPS: 100.0000 mg | ORAL_CAPSULE | Freq: Two times a day (BID) | ORAL | 0 refills | Status: DC

## 2017-08-22 NOTE — Progress Notes (Signed)
GYNECOLOGY  VISIT  CC:   Lower abdominal pain  HPI: 76 y.o. G3P3 Widowed Caucasian female here for complaint of lower pelvic pain and pressure that has been going on for "several days" this week.  Denies vaginal bleeding.  Denies vaginal discharge although seeing a little yellowish streaking in her underwear.  Denies fevers.  Denies diarrhea or constipation.  No back pain.  With upcoming weather, thought she should be seen instead of waiting to see if this resolved or got worse.    GYNECOLOGIC HISTORY: Patient's last menstrual period was 09/16/1993. Contraception: PMP Menopausal hormone therapy: none  Patient Active Problem List   Diagnosis Date Noted  . 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 BBB (bundle branch block) 07/27/2015  . Gastroenteritis 11/21/2014  . Paresthesia 03/25/2014  . Neck pain 02/14/2014    Past Medical History:  Diagnosis Date  . Clostridium difficile diarrhea   . LBBB (left bundle branch block)    02/2014  . Osteopenia    may be starting the prolia injection/in between osteopenia and osteoporosis    Past Surgical History:  Procedure Laterality Date  . ABDOMINAL HYSTERECTOMY     TAH/BSO  . APPENDECTOMY    . TUBAL LIGATION      MEDS:   Current Outpatient Medications on File Prior to Visit  Medication Sig Dispense Refill  . acidophilus (RISAQUAD) CAPS capsule Take 1 capsule by mouth daily.    Marland Kitchen ALPRAZolam (XANAX) 0.25 MG tablet TAKE 1 TABLET BY MOUTH AT BEDTIME AS NEEDED 30 tablet 1  . aspirin EC 81 MG tablet Take by mouth.    . Cholecalciferol (VITAMIN D PO) Take 1-2 tablets by mouth.    . fluticasone (FLONASE) 50 MCG/ACT nasal spray     . Multiple Vitamins-Minerals (MULTIVITAMIN WITH MINERALS) tablet Take 1 tablet by mouth daily.    Marland Kitchen omeprazole (PRILOSEC) 20 MG capsule TAKE ONE CAPSULE EVERY DAY     No current facility-administered medications on file prior to visit.     ALLERGIES: Ciprofloxacin; Darvon; and Mtx support [cobalamine  combinations]  Family History  Problem Relation Age of Onset  . Thyroid disease Mother   . Breast cancer Maternal Aunt 60  . Heart disease Maternal Grandmother   . Heart attack Paternal Grandmother   . Heart attack Brother   . Hodgkin's lymphoma Son 63  . Colon cancer Cousin     SH:  Married, non smoker  Review of Systems  Constitutional: Negative.   Respiratory: Negative.   Cardiovascular: Negative.   Gastrointestinal: Negative.     PHYSICAL EXAMINATION:    BP 136/76 (BP Location: Right Arm, Patient Position: Sitting, Cuff Size: Normal)   Pulse 64   Temp 98.1 F (36.7 C) (Oral)   Resp 14   Wt 149 lb (67.6 kg)   LMP 09/16/1993   BMI 27.47 kg/m     General appearance: alert, cooperative and appears stated age CV:  Regular rate and rhythm Lungs:  clear to auscultation, no wheezes, rales or rhonchi, symmetric air entry Abdomen: soft, non-tender; bowel sounds normal; no masses,  no organomegaly  Pelvic: External genitalia:  no lesions              Urethra:  normal appearing urethra with no masses, tenderness or lesions              Bartholins and Skenes: normal                 Vagina: normal appearing vagina with normal color  and slight watery discharge, no lesions              Cervix: absent              Bimanual Exam:  Uterus:  uterus absent              Adnexa: no mass, fullness, tenderness              Anus: no lesions  Chaperone was present for exam.  Assessment: Mild pelvic pain/pelvic pressure Non-acute abdomen H/o recurrent vaginitis  Plan: Urine culture pending Vaginitis testing pending With upcoming weather with risks of significant snow, will treat with diflucan 150mg  po x 1, repeat 72 hours and macrobid 100mg  bid x 3 days.

## 2017-08-22 NOTE — Telephone Encounter (Signed)
Patient says she feel like she may have symptoms of a yeast or kidney infection.

## 2017-08-22 NOTE — Telephone Encounter (Signed)
Spoke with patient. Reports pelvic and groin discomfort, with discomfort "around the waist"  for the past several days. Requesting OV.   Denies fever/chills, N/V, bleeding, vag d/c odor.   Has been applying old RX of "vaginal cream" with no relief. OV scheduled for today at 2:30pm with Dr. Sabra Heck. Patient is agreeable to date and time.   Last AEX 11/18/16  Routing to provider for final review. Patient is agreeable to disposition. Will close encounter.

## 2017-08-22 NOTE — Telephone Encounter (Signed)
Patient in office for appointment on 08/22/17. Spoke with patient at appointment and she states that she is waiting to call [facility] to schedule shipment of prolia because she is experiencing TMJ and waiting for that to resolve prior to receiving her next prolia injection.   Routing to provider for final review as FYI. Patient agreeable to disposition. Will close encounter.

## 2017-08-23 LAB — URINE CULTURE

## 2017-08-24 LAB — VAGINITIS/VAGINOSIS, DNA PROBE
CANDIDA SPECIES: NEGATIVE
Gardnerella vaginalis: NEGATIVE
Trichomonas vaginosis: NEGATIVE

## 2017-08-26 ENCOUNTER — Telehealth: Payer: Self-pay | Admitting: *Deleted

## 2017-08-26 MED ORDER — TERCONAZOLE 0.4 % VA CREA: 1.0000 | TOPICAL_CREAM | Freq: Every day | VAGINAL | 0 refills | Status: DC

## 2017-08-26 NOTE — Telephone Encounter (Signed)
Patient notified- Rx sent to pharmacy. 

## 2017-08-26 NOTE — Telephone Encounter (Signed)
Patient states she still having discharge and mild burning. She got better with medication given on Friday but symptoms came back. She started with abx, took 3 but states she will stop taking them since culture was negative.  Please advise.

## 2017-08-26 NOTE — Telephone Encounter (Signed)
Left voicemail to call back. Route to triage if I am not available.   Rx pended. Need to confirm pharmacy.

## 2017-08-26 NOTE — Telephone Encounter (Signed)
Will treat with Terazol 7 nightly x 7 days.  Ok to call in rx for pt.  Thanks.

## 2017-08-26 NOTE — Telephone Encounter (Signed)
-----   Message from Megan Salon, MD sent at 08/25/2017 11:04 PM EST ----- Please let pt know her urine culture and testing for yeast and BV were negative.  I did treat her last week due to the threat of weather.  Please see how she is doing.  Thanks.

## 2017-09-08 ENCOUNTER — Ambulatory Visit
Admission: RE | Admit: 2017-09-08 | Discharge: 2017-09-08 | Disposition: A | Discharge status: Home / Self Care | Payer: PPO | Source: Ambulatory Visit | Attending: Obstetrics & Gynecology | Admitting: Obstetrics & Gynecology

## 2017-09-08 DIAGNOSIS — Z139 Encounter for screening, unspecified: Secondary | ICD-10-CM

## 2017-09-08 DIAGNOSIS — Z1231 Encounter for screening mammogram for malignant neoplasm of breast: Secondary | ICD-10-CM | POA: Diagnosis not present

## 2017-10-06 DIAGNOSIS — L570 Actinic keratosis: Secondary | ICD-10-CM | POA: Diagnosis not present

## 2017-10-06 DIAGNOSIS — Z1283 Encounter for screening for malignant neoplasm of skin: Secondary | ICD-10-CM | POA: Diagnosis not present

## 2017-10-06 DIAGNOSIS — X32XXXD Exposure to sunlight, subsequent encounter: Secondary | ICD-10-CM | POA: Diagnosis not present

## 2017-10-06 DIAGNOSIS — L820 Inflamed seborrheic keratosis: Secondary | ICD-10-CM | POA: Diagnosis not present

## 2017-10-13 DIAGNOSIS — M542 Cervicalgia: Secondary | ICD-10-CM | POA: Diagnosis not present

## 2017-10-13 DIAGNOSIS — Z6826 Body mass index (BMI) 26.0-26.9, adult: Secondary | ICD-10-CM | POA: Diagnosis not present

## 2017-10-13 DIAGNOSIS — R0989 Other specified symptoms and signs involving the circulatory and respiratory systems: Secondary | ICD-10-CM | POA: Diagnosis not present

## 2017-10-13 DIAGNOSIS — J0100 Acute maxillary sinusitis, unspecified: Secondary | ICD-10-CM | POA: Diagnosis not present

## 2017-12-02 ENCOUNTER — Telehealth: Payer: Self-pay | Admitting: *Deleted

## 2017-12-02 NOTE — Telephone Encounter (Signed)
Notice received from [facility], unable to reach patient to schedule delivery of Prolia.   Last Prolia received in office on 01/23/17.   Spoke with patient. 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  Patient states she has an appointment with PCP/Dr. Dagmar Hait and plans to discuss repeating BMD prior to receiving another prolia injection. Advised patient will update Dr. Sabra Heck and [place], return call to office with any further concerns/questions.   Completed form from [place] to Dr. Sabra Heck for signature.   Routing to Dr. Sabra Heck

## 2017-12-03 DIAGNOSIS — R300 Dysuria: Secondary | ICD-10-CM | POA: Diagnosis not present

## 2017-12-03 DIAGNOSIS — M546 Pain in thoracic spine: Secondary | ICD-10-CM | POA: Diagnosis not present

## 2017-12-03 DIAGNOSIS — K219 Gastro-esophageal reflux disease without esophagitis: Secondary | ICD-10-CM | POA: Diagnosis not present

## 2017-12-03 DIAGNOSIS — Z6827 Body mass index (BMI) 27.0-27.9, adult: Secondary | ICD-10-CM | POA: Diagnosis not present

## 2017-12-03 DIAGNOSIS — M810 Age-related osteoporosis without current pathological fracture: Secondary | ICD-10-CM | POA: Diagnosis not present

## 2017-12-08 DIAGNOSIS — D0462 Carcinoma in situ of skin of left upper limb, including shoulder: Secondary | ICD-10-CM | POA: Diagnosis not present

## 2017-12-08 DIAGNOSIS — X32XXXD Exposure to sunlight, subsequent encounter: Secondary | ICD-10-CM | POA: Diagnosis not present

## 2017-12-08 DIAGNOSIS — L570 Actinic keratosis: Secondary | ICD-10-CM | POA: Diagnosis not present

## 2017-12-09 ENCOUNTER — Telehealth: Payer: Self-pay | Admitting: Obstetrics & Gynecology

## 2017-12-09 NOTE — Telephone Encounter (Signed)
Patient would like to discuss getting the prolia injection.

## 2017-12-09 NOTE — Telephone Encounter (Signed)
Spoke with patient. Patient is undecided about prolia, "curious of any change", due for BMD. Last BMD 11/29/14 at [facility], osteoporosis. Received one prolia injection on 01/23/17, did not continue d/t "TMJ'.  "TMJ symptoms 90% resolved".   Recommended patient call to schedule BMD with [facility], Dr. Sabra Heck can review results once completed and advise. Advised patient Dr. Sabra Heck will review, I will return call with any additional recommendations. Patient verbalizes understanding.   Routing to provider for final review. Patient is agreeable to disposition. Will close encounter.

## 2017-12-29 DIAGNOSIS — E785 Hyperlipidemia, unspecified: Secondary | ICD-10-CM | POA: Diagnosis not present

## 2017-12-29 DIAGNOSIS — E040 Nontoxic diffuse goiter: Secondary | ICD-10-CM | POA: Diagnosis not present

## 2017-12-29 DIAGNOSIS — R82998 Other abnormal findings in urine: Secondary | ICD-10-CM | POA: Diagnosis not present

## 2017-12-29 DIAGNOSIS — M810 Age-related osteoporosis without current pathological fracture: Secondary | ICD-10-CM | POA: Diagnosis not present

## 2018-01-05 DIAGNOSIS — Z6826 Body mass index (BMI) 26.0-26.9, adult: Secondary | ICD-10-CM | POA: Diagnosis not present

## 2018-01-05 DIAGNOSIS — E040 Nontoxic diffuse goiter: Secondary | ICD-10-CM | POA: Diagnosis not present

## 2018-01-05 DIAGNOSIS — K589 Irritable bowel syndrome without diarrhea: Secondary | ICD-10-CM | POA: Diagnosis not present

## 2018-01-05 DIAGNOSIS — I447 Left bundle-branch block, unspecified: Secondary | ICD-10-CM | POA: Diagnosis not present

## 2018-01-05 DIAGNOSIS — F418 Other specified anxiety disorders: Secondary | ICD-10-CM | POA: Diagnosis not present

## 2018-01-05 DIAGNOSIS — Z0000 Encounter for general adult medical examination without abnormal findings: Secondary | ICD-10-CM | POA: Diagnosis not present

## 2018-01-05 DIAGNOSIS — M1990 Unspecified osteoarthritis, unspecified site: Secondary | ICD-10-CM | POA: Diagnosis not present

## 2018-01-05 DIAGNOSIS — Z1389 Encounter for screening for other disorder: Secondary | ICD-10-CM | POA: Diagnosis not present

## 2018-01-05 DIAGNOSIS — Z0131 Encounter for examination of blood pressure with abnormal findings: Secondary | ICD-10-CM | POA: Diagnosis not present

## 2018-01-05 DIAGNOSIS — M810 Age-related osteoporosis without current pathological fracture: Secondary | ICD-10-CM | POA: Diagnosis not present

## 2018-01-05 DIAGNOSIS — J3089 Other allergic rhinitis: Secondary | ICD-10-CM | POA: Diagnosis not present

## 2018-01-05 DIAGNOSIS — E7849 Other hyperlipidemia: Secondary | ICD-10-CM | POA: Diagnosis not present

## 2018-01-06 ENCOUNTER — Other Ambulatory Visit: Payer: Self-pay | Admitting: Internal Medicine

## 2018-01-06 DIAGNOSIS — R1011 Right upper quadrant pain: Secondary | ICD-10-CM

## 2018-01-13 ENCOUNTER — Ambulatory Visit
Admission: RE | Admit: 2018-01-13 | Discharge: 2018-01-13 | Disposition: A | Discharge status: Home / Self Care | Payer: PPO | Source: Ambulatory Visit | Attending: Internal Medicine | Admitting: Internal Medicine

## 2018-01-13 DIAGNOSIS — R1011 Right upper quadrant pain: Secondary | ICD-10-CM

## 2018-01-22 DIAGNOSIS — K760 Fatty (change of) liver, not elsewhere classified: Secondary | ICD-10-CM | POA: Diagnosis not present

## 2018-01-22 DIAGNOSIS — K219 Gastro-esophageal reflux disease without esophagitis: Secondary | ICD-10-CM | POA: Diagnosis not present

## 2018-01-22 DIAGNOSIS — Z1211 Encounter for screening for malignant neoplasm of colon: Secondary | ICD-10-CM | POA: Diagnosis not present

## 2018-01-29 ENCOUNTER — Other Ambulatory Visit: Payer: Self-pay | Admitting: Obstetrics & Gynecology

## 2018-01-29 NOTE — Telephone Encounter (Signed)
Medication refill request: Xanax Last AEX:  11-18-16  Next AEX: 02-23-18 Last MMG (if hormonal medication request): 09-08-17 WNL  Refill authorized: please advise

## 2018-02-02 DIAGNOSIS — Z85828 Personal history of other malignant neoplasm of skin: Secondary | ICD-10-CM | POA: Diagnosis not present

## 2018-02-02 DIAGNOSIS — Z08 Encounter for follow-up examination after completed treatment for malignant neoplasm: Secondary | ICD-10-CM | POA: Diagnosis not present

## 2018-02-23 ENCOUNTER — Encounter: Payer: Self-pay | Admitting: Obstetrics & Gynecology

## 2018-02-23 ENCOUNTER — Other Ambulatory Visit: Payer: Self-pay

## 2018-02-23 ENCOUNTER — Ambulatory Visit (INDEPENDENT_AMBULATORY_CARE_PROVIDER_SITE_OTHER): Payer: PPO | Admitting: Obstetrics & Gynecology

## 2018-02-23 VITALS — BP 124/90 | HR 68 | Resp 14 | Ht 62.0 in | Wt 146.0 lb

## 2018-02-23 DIAGNOSIS — Z01419 Encounter for gynecological examination (general) (routine) without abnormal findings: Secondary | ICD-10-CM | POA: Diagnosis not present

## 2018-02-23 NOTE — Progress Notes (Signed)
77 y.o. G3P3 WidowedCaucasianF here for annual exam.  Frustrated with waiting today.    Did see Dr. Dagmar Hait in April.  Did do blood work when she saw him in April.  This was all good.  Seeing Dr. Collene Mares this summer.  Has the colonoscopy scheduled for June 24th.    Patient's last menstrual period was 09/16/1993.          Sexually active: No.  The current method of family planning is status post hysterectomy.    Exercising: Yes.    walking, zumba  Smoker:  no  Health Maintenance: Pap:  11/18/16 Neg   09/13/14 Neg  History of abnormal Pap:  no MMG:  09/08/17 BIRADS1:Neg Colonoscopy:  04/2008 Dr. Cristina Gong  BMD:   11/29/14 TDaP:  2017 Pneumonia vaccine(s):  Done  Shingrix:   No.  Discussed this is pt.   Hep C testing: n/a Screening Labs: PCP   reports that she has never smoked. She has never used smokeless tobacco. She reports that she does not drink alcohol or use drugs.  Past Medical History:  Diagnosis Date  . Clostridium difficile diarrhea   . LBBB (left bundle branch block)    02/2014  . Osteopenia    may be starting the prolia injection/in between osteopenia and osteoporosis    Past Surgical History:  Procedure Laterality Date  . ABDOMINAL HYSTERECTOMY     TAH/BSO  . APPENDECTOMY    . BREAST BIOPSY Right    benign  . TUBAL LIGATION      Current Outpatient Medications  Medication Sig Dispense Refill  . acidophilus (RISAQUAD) CAPS capsule Take 1 capsule by mouth daily.    Marland Kitchen ALPRAZolam (XANAX) 0.25 MG tablet TAKE 1 TABLET BY MOUTH EVERY DAY AT BEDTIME AS NEEDED 30 tablet 0  . Cholecalciferol (VITAMIN D PO) Take 1-2 tablets by mouth.    . fluticasone (FLONASE) 50 MCG/ACT nasal spray     . Multiple Vitamins-Minerals (MULTIVITAMIN WITH MINERALS) tablet Take 1 tablet by mouth daily.    Marland Kitchen omeprazole (PRILOSEC) 20 MG capsule TAKE ONE CAPSULE EVERY DAY    . aspirin EC 81 MG tablet Take 81 mg by mouth daily as needed.      No current facility-administered medications for this visit.      Family History  Problem Relation Age of Onset  . Thyroid disease Mother   . Breast cancer Maternal Aunt 60  . Heart disease Maternal Grandmother   . Heart attack Paternal Grandmother   . Heart attack Brother   . Hodgkin's lymphoma Son 52  . Colon cancer Cousin     Review of Systems  All other systems reviewed and are negative.   Exam:   BP 124/90 (BP Location: Right Arm, Patient Position: Sitting, Cuff Size: Large)   Pulse 68   Resp 14   Ht 5\' 2"  (1.575 m)   Wt 146 lb (66.2 kg)   LMP 09/16/1993   BMI 26.70 kg/m     Height: 5\' 2"  (157.5 cm)  Ht Readings from Last 3 Encounters:  02/23/18 5\' 2"  (1.575 m)  11/18/16 5' 1.75" (1.568 m)  08/26/16 5' 2.5" (1.588 m)    General appearance: alert, cooperative and appears stated age Head: Normocephalic, without obvious abnormality, atraumatic Neck: no adenopathy, supple, symmetrical, trachea midline and thyroid normal to inspection and palpation Lungs: clear to auscultation bilaterally Breasts: normal appearance, no masses or tenderness Heart: regular rate and rhythm Abdomen: soft, non-tender; bowel sounds normal; no masses,  no organomegaly  Extremities: extremities normal, atraumatic, no cyanosis or edema Skin: Skin color, texture, turgor normal. No rashes or lesions Lymph nodes: Cervical, supraclavicular, and axillary nodes normal. No abnormal inguinal nodes palpated Neurologic: Grossly normal   Pelvic: External genitalia:  no lesions              Urethra:  normal appearing urethra with no masses, tenderness or lesions              Bartholins and Skenes: normal                 Vagina: normal appearing vagina with normal color and discharge, no lesions              Cervix: no lesions              Pap taken: No. Bimanual Exam:  Uterus:  normal size, contour, position, consistency, mobility, non-tender              Adnexa: normal adnexa and no mass, fullness, tenderness               Rectovaginal: Confirms                Anus:  normal sphincter tone, no lesions  Chaperone was present for exam.  A:  Well Woman with normal exam PMP, no HRT H/O TAH/BSO Osteoporosis, did BMD last year.  Last prolia injection was 5/18) Left BBB H/O C. Diff colitis 2013 Anxiety  P:   Mammogram guidelines reviewed Release for BMD signed today pap smear obtained 2018, not indicated today Lab work obtained  Uses Xanax 1/2 tab 0.25mg  nightly for anxiety.  Has rx. D/w pt with Shingrix vaccination.  She will check with her pharmacy. Return annually or prn

## 2018-03-09 ENCOUNTER — Telehealth: Payer: Self-pay | Admitting: Obstetrics & Gynecology

## 2018-03-09 NOTE — Telephone Encounter (Signed)
Routing to Karmen Bongo, Therapist, sports for f/u.

## 2018-03-09 NOTE — Telephone Encounter (Signed)
Patient states Dr. Sabra Heck recommeded Cologuard for her and she has not received it and wanted to follow up. Please advise.

## 2018-03-10 NOTE — Telephone Encounter (Signed)
Patient calling to follow up on Cologuard order. Says she has had to cancel her colonoscopy and endoscopy due to not receiving it in time.

## 2018-03-10 NOTE — Telephone Encounter (Signed)
Cologuard order placed on your desk.  Raquel Sarna just did it for me this week.  Can you fax it?  Just please advise pt I only discussed cancelling colonoscopy with Dr. Collene Mares, not the endoscopy.  Just FYI for pt.

## 2018-03-10 NOTE — Telephone Encounter (Signed)
Spoke with patient. Patient cancelled colonoscopy and endoscopy scheduled for 03/09/18. Patient states she discussed with Dr. Sabra Heck having cologuard prior to colonoscopy. Patient calling for update on cologuard, has not received a call or kit, has not rescheduled colonoscopy.   Advised patient will review with Dr. Sabra Heck and return call with update. Patient agreeable.    Reviewed with E.Caldwell, RN -cologuard order to Dr. Sabra Heck for signature.   Dr. Sabra Heck -please review.

## 2018-03-11 NOTE — Telephone Encounter (Signed)
Spoke with patient, advised as seen below per Dr. Sabra Heck. Patient is aware only discussed colonoscopy, she will reschedule endoscopy after cologuard completed, has already spoken with Dr. Lorie Apley office. Advised patient she will be contacted directly by [employer] regarding Cologuard, our office will f/u once results received.   Routing to provider for final review. Patient is agreeable to disposition. Will close encounter.   Cc: Karmen Bongo, RN

## 2018-03-11 NOTE — Telephone Encounter (Addendum)
Left message to call Sharee Pimple at [phone].   Cologuard order faxed to [facility].

## 2018-03-31 DIAGNOSIS — Z1211 Encounter for screening for malignant neoplasm of colon: Secondary | ICD-10-CM | POA: Diagnosis not present

## 2018-03-31 DIAGNOSIS — Z1212 Encounter for screening for malignant neoplasm of rectum: Secondary | ICD-10-CM | POA: Diagnosis not present

## 2018-03-31 LAB — COLOGUARD: COLOGUARD: NEGATIVE

## 2018-04-02 DIAGNOSIS — Z6825 Body mass index (BMI) 25.0-25.9, adult: Secondary | ICD-10-CM | POA: Diagnosis not present

## 2018-04-02 DIAGNOSIS — I447 Left bundle-branch block, unspecified: Secondary | ICD-10-CM | POA: Diagnosis not present

## 2018-04-02 DIAGNOSIS — Z0131 Encounter for examination of blood pressure with abnormal findings: Secondary | ICD-10-CM | POA: Diagnosis not present

## 2018-04-02 DIAGNOSIS — F418 Other specified anxiety disorders: Secondary | ICD-10-CM | POA: Diagnosis not present

## 2018-04-07 ENCOUNTER — Other Ambulatory Visit: Payer: Self-pay | Admitting: Obstetrics & Gynecology

## 2018-04-08 NOTE — Telephone Encounter (Signed)
Medication refill request: Xanax  0.25  Last AEX:  02/23/18 Next AEX: 06/08/19 Last MMG (if hormonal medication request):  09/08/17 Bi-rads category 1 neg  Refill authorized: Please refill if appropriate.

## 2018-04-16 ENCOUNTER — Telehealth: Payer: Self-pay | Admitting: Obstetrics & Gynecology

## 2018-04-16 DIAGNOSIS — Z1212 Encounter for screening for malignant neoplasm of rectum: Secondary | ICD-10-CM

## 2018-04-16 DIAGNOSIS — Z1213 Encounter for screening for malignant neoplasm of small intestine: Secondary | ICD-10-CM

## 2018-04-16 NOTE — Telephone Encounter (Signed)
Spoke with patient.  1. Advised of negative cologuard results per Dr. Sabra Heck. Results entered into Epic.   2. Patient requesting to follow-up with recommendations from BMD dated 12/02/16 at [facility]/Dr. Dagmar Hait, Dr. Sabra Heck to review and advise on Prolia?   Advised will review with Dr. Sabra Heck and return call. Patient agreeable.  Dr. Sabra Heck -please advise

## 2018-04-16 NOTE — Telephone Encounter (Signed)
Patient requesting results from Cologard test.

## 2018-04-17 DIAGNOSIS — I10 Essential (primary) hypertension: Secondary | ICD-10-CM | POA: Diagnosis not present

## 2018-04-17 DIAGNOSIS — Z6825 Body mass index (BMI) 25.0-25.9, adult: Secondary | ICD-10-CM | POA: Diagnosis not present

## 2018-04-29 NOTE — Telephone Encounter (Signed)
Please let her know her BMD was much better last year.  As she is off all treatment, I think she should repeat this in March and see where she is.  Current recommendation about prolia is to start on something like Fosamax after Prolia is stopped to help maintain bone density for longer.  However, since BMD due in March, I think ok to wait and see what it looks like then before deciding.  Thanks.

## 2018-04-29 NOTE — Telephone Encounter (Signed)
Left message to call Chandell Attridge at [phone].  

## 2018-05-05 NOTE — Telephone Encounter (Signed)
Spoke with patient, advised as seen below per Dr. Miller. Patient verbalizes understanding and is agreeable.   Encounter closed.  

## 2018-05-06 DIAGNOSIS — Z6824 Body mass index (BMI) 24.0-24.9, adult: Secondary | ICD-10-CM | POA: Diagnosis not present

## 2018-05-06 DIAGNOSIS — I10 Essential (primary) hypertension: Secondary | ICD-10-CM | POA: Diagnosis not present

## 2018-05-22 ENCOUNTER — Other Ambulatory Visit: Payer: Self-pay | Admitting: Obstetrics & Gynecology

## 2018-05-22 NOTE — Telephone Encounter (Signed)
Medication refill request: alprazolam Last AEX:  02/23/2018 Next AEX: *06/08/2019 Last MMG (if hormonal medication request): 09/08/2017 Refill authorized: #30, 0 refills

## 2018-05-28 ENCOUNTER — Telehealth: Payer: Self-pay | Admitting: Obstetrics & Gynecology

## 2018-05-28 NOTE — Telephone Encounter (Signed)
Patient called to checked status of medication refill. States she called [facility] on [address] earlier this week  for refill, but states it still has not been called in  Routing to Triage Nurse

## 2018-05-28 NOTE — Telephone Encounter (Signed)
Return call to patient. Advised Rx sent in on 05/22/18 and confirmed [facility] has it ready for her.  Encounter closed.

## 2018-07-08 DIAGNOSIS — I8390 Asymptomatic varicose veins of unspecified lower extremity: Secondary | ICD-10-CM | POA: Diagnosis not present

## 2018-07-08 DIAGNOSIS — M79604 Pain in right leg: Secondary | ICD-10-CM | POA: Diagnosis not present

## 2018-07-08 DIAGNOSIS — Z6824 Body mass index (BMI) 24.0-24.9, adult: Secondary | ICD-10-CM | POA: Diagnosis not present

## 2018-07-09 ENCOUNTER — Other Ambulatory Visit: Payer: Self-pay

## 2018-07-09 DIAGNOSIS — I8390 Asymptomatic varicose veins of unspecified lower extremity: Secondary | ICD-10-CM

## 2018-07-13 ENCOUNTER — Other Ambulatory Visit: Payer: Self-pay | Admitting: Obstetrics & Gynecology

## 2018-07-13 NOTE — Telephone Encounter (Signed)
Medication refill request: xanax Last AEX:  02/23/18 SM Next AEX: 06/08/19 Last MMG (if hormonal medication request): 09/08/17 BIRADS1:Neg  Refill authorized: 05/22/18 #30/0R. Today please advise.

## 2018-07-28 DIAGNOSIS — H40033 Anatomical narrow angle, bilateral: Secondary | ICD-10-CM | POA: Diagnosis not present

## 2018-07-28 DIAGNOSIS — H0100A Unspecified blepharitis right eye, upper and lower eyelids: Secondary | ICD-10-CM | POA: Diagnosis not present

## 2018-07-28 DIAGNOSIS — H524 Presbyopia: Secondary | ICD-10-CM | POA: Diagnosis not present

## 2018-07-28 DIAGNOSIS — H2513 Age-related nuclear cataract, bilateral: Secondary | ICD-10-CM | POA: Diagnosis not present

## 2018-07-30 ENCOUNTER — Other Ambulatory Visit: Payer: Self-pay

## 2018-07-30 ENCOUNTER — Ambulatory Visit (INDEPENDENT_AMBULATORY_CARE_PROVIDER_SITE_OTHER): Payer: PPO | Admitting: Obstetrics & Gynecology

## 2018-07-30 ENCOUNTER — Encounter: Payer: Self-pay | Admitting: Obstetrics & Gynecology

## 2018-07-30 VITALS — BP 156/80 | HR 72 | Resp 16 | Ht 62.0 in | Wt 137.6 lb

## 2018-07-30 DIAGNOSIS — N898 Other specified noninflammatory disorders of vagina: Secondary | ICD-10-CM | POA: Diagnosis not present

## 2018-07-30 DIAGNOSIS — R300 Dysuria: Secondary | ICD-10-CM

## 2018-07-30 LAB — POCT URINALYSIS DIPSTICK
BILIRUBIN UA: NEGATIVE
Glucose, UA: NEGATIVE
KETONES UA: NEGATIVE
NITRITE UA: NEGATIVE
Protein, UA: NEGATIVE
RBC UA: NEGATIVE
UROBILINOGEN UA: 0.2 U/dL
pH, UA: 5 (ref 5.0–8.0)

## 2018-07-30 MED ORDER — ESTRADIOL 0.1 MG/GM VA CREA: TOPICAL_CREAM | VAGINAL | 0 refills | Status: DC

## 2018-07-30 NOTE — Progress Notes (Signed)
GYNECOLOGY  VISIT  CC:   Vaginal irritation, mild dysuria  HPI: 77 y.o. G3P3 Widowed White or Caucasian female here for vaginal discharge and a sensation that her skin is burning with urination.  This has been going on about 2 weeks.  Denies vaginal bleeding.  Reports it doesn't really feel like a UTI but there is some mild burning with urination (but different than cystitis "burning").    Denies back pain but has a little pelvic pressure.  Denies fever.    GYNECOLOGIC HISTORY: Patient's last menstrual period was 09/16/1993. Contraception: Hysterectomy  Menopausal hormone therapy: none  Patient Active Problem List   Diagnosis Date Noted  . 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 BBB (bundle branch block) 07/27/2015  . Gastroenteritis 11/21/2014  . Paresthesia 03/25/2014  . Neck pain 02/14/2014    Past Medical History:  Diagnosis Date  . Clostridium difficile diarrhea   . LBBB (left bundle branch block)    02/2014  . Osteopenia    may be starting the prolia injection/in between osteopenia and osteoporosis    Past Surgical History:  Procedure Laterality Date  . ABDOMINAL HYSTERECTOMY     TAH/BSO  . APPENDECTOMY    . BREAST BIOPSY Right    benign  . TUBAL LIGATION      MEDS:   Current Outpatient Medications on File Prior to Visit  Medication Sig Dispense Refill  . acidophilus (RISAQUAD) CAPS capsule Take 1 capsule by mouth daily.    Marland Kitchen ALPRAZolam (XANAX) 0.25 MG tablet TAKE 1 TABLET BY MOUTH ONCE DAILY AT BEDTIME AS NEEDED 30 tablet 0  . aspirin EC 81 MG tablet Take 81 mg by mouth daily as needed.     . Calcium Carbonate-Vit D-Min (CALCIUM 1200 PO) Take by mouth daily.    . Cholecalciferol (VITAMIN D PO) Take 1-2 tablets by mouth.    . fluticasone (FLONASE) 50 MCG/ACT nasal spray     . irbesartan (AVAPRO) 75 MG tablet Take 1 tablet by mouth daily.  0  . Multiple Vitamins-Minerals (MULTIVITAMIN WITH MINERALS) tablet Take 1 tablet by mouth daily.    Marland Kitchen omeprazole  (PRILOSEC) 20 MG capsule TAKE ONE CAPSULE EVERY DAY     No current facility-administered medications on file prior to visit.     ALLERGIES: Ciprofloxacin; Darvon; and Mtx support [cobalamin combinations]  Family History  Problem Relation Age of Onset  . Thyroid disease Mother   . Breast cancer Maternal Aunt 60  . Heart disease Maternal Grandmother   . Heart attack Paternal Grandmother   . Heart attack Brother   . Hodgkin's lymphoma Son 48  . Colon cancer Cousin     SH:  Widowed, non smoker  Review of Systems  Genitourinary: Positive for dysuria.       Vaginal itching   All other systems reviewed and are negative.   PHYSICAL EXAMINATION:    BP (!) 156/80 (BP Location: Right Arm, Patient Position: Sitting, Cuff Size: Normal)   Pulse 72   Resp 16   Ht 5\' 2"  (1.575 m)   Wt 137 lb 9.6 oz (62.4 kg)   LMP 09/16/1993   BMI 25.17 kg/m     General appearance: alert, cooperative and appears stated age NAbdomen: soft, non-tender; bowel sounds normal; no masses,  no organomegaly Lymph:  no inguinal LAD noted  Pelvic: External genitalia:  no lesions              Urethra: erythematous at urethra, no masses or firmness  Bartholins and Skenes: normal                 Vagina: normal appearing vagina with normal color and discharge, no lesions              Cervix: absent              Bimanual Exam:  Uterus:  uterus absent              Adnexa: no mass, fullness, tenderness  Chaperone was present for exam.  Assessment: Vaginal discharge/irritation Urethral burning semsaton  Plan: Vaginitis testing and urine culture obtained.  I think the symptoms are from the erythematous urethral fin

## 2018-07-31 LAB — VAGINITIS/VAGINOSIS, DNA PROBE
CANDIDA SPECIES: NEGATIVE
Gardnerella vaginalis: NEGATIVE
Trichomonas vaginosis: NEGATIVE

## 2018-07-31 LAB — URINE CULTURE: Organism ID, Bacteria: NO GROWTH

## 2018-08-04 ENCOUNTER — Other Ambulatory Visit: Payer: Self-pay | Admitting: Obstetrics & Gynecology

## 2018-08-04 DIAGNOSIS — Z1231 Encounter for screening mammogram for malignant neoplasm of breast: Secondary | ICD-10-CM

## 2018-08-26 DIAGNOSIS — F419 Anxiety disorder, unspecified: Secondary | ICD-10-CM | POA: Diagnosis not present

## 2018-08-26 DIAGNOSIS — Z6824 Body mass index (BMI) 24.0-24.9, adult: Secondary | ICD-10-CM | POA: Diagnosis not present

## 2018-08-26 DIAGNOSIS — I10 Essential (primary) hypertension: Secondary | ICD-10-CM | POA: Diagnosis not present

## 2018-08-26 DIAGNOSIS — M1990 Unspecified osteoarthritis, unspecified site: Secondary | ICD-10-CM | POA: Diagnosis not present

## 2018-08-26 DIAGNOSIS — I8390 Asymptomatic varicose veins of unspecified lower extremity: Secondary | ICD-10-CM | POA: Diagnosis not present

## 2018-09-11 ENCOUNTER — Other Ambulatory Visit: Payer: Self-pay | Admitting: Obstetrics & Gynecology

## 2018-09-11 NOTE — Telephone Encounter (Signed)
Medication refill request: Alprazolam Last AEX:  02/23/18 SM Next AEX: 06/08/19 Last MMG (if hormonal medication request): 09/08/17 BIRADS 1 negative/density c -- scheduled 09/21/18  Refill authorized: 07/13/18 #30 w/0 refills; today please advise

## 2018-09-21 ENCOUNTER — Ambulatory Visit
Admission: RE | Admit: 2018-09-21 | Discharge: 2018-09-21 | Disposition: A | Discharge status: Home / Self Care | Payer: PPO | Source: Ambulatory Visit | Attending: Obstetrics & Gynecology | Admitting: Obstetrics & Gynecology

## 2018-09-21 DIAGNOSIS — Z1231 Encounter for screening mammogram for malignant neoplasm of breast: Secondary | ICD-10-CM

## 2018-09-30 ENCOUNTER — Ambulatory Visit (INDEPENDENT_AMBULATORY_CARE_PROVIDER_SITE_OTHER): Payer: PPO | Admitting: Vascular Surgery

## 2018-09-30 ENCOUNTER — Encounter: Payer: Self-pay | Admitting: Vascular Surgery

## 2018-09-30 ENCOUNTER — Other Ambulatory Visit: Payer: Self-pay

## 2018-09-30 ENCOUNTER — Ambulatory Visit (HOSPITAL_COMMUNITY)
Admission: RE | Admit: 2018-09-30 | Discharge: 2018-09-30 | Disposition: A | Discharge status: Home / Self Care | Payer: PPO | Source: Ambulatory Visit | Attending: Vascular Surgery | Admitting: Vascular Surgery

## 2018-09-30 VITALS — BP 143/66 | HR 67 | Temp 97.6°F | Resp 18 | Ht 63.0 in | Wt 137.6 lb

## 2018-09-30 DIAGNOSIS — I83811 Varicose veins of right lower extremities with pain: Secondary | ICD-10-CM

## 2018-09-30 DIAGNOSIS — I8390 Asymptomatic varicose veins of unspecified lower extremity: Secondary | ICD-10-CM | POA: Diagnosis not present

## 2018-09-30 NOTE — Progress Notes (Signed)
REASON FOR CONSULT:    Right leg pain with varicose veins right lower extremity.  The consult is requested by Dr. Dagmar Hait.  HPI:   Sylvia Kennedy is a pleasant 78 y.o. female, who is referred for evaluation of right leg pain and varicose veins of the right lower extremity.  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  She states that these have been relatively stable.  They do not cause a lot of symptoms.  She denies significant aching or heaviness in the legs with standing.  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  However, about a year ago she began doing Silver sneakers and was exercising more and noted some tingling and burning in her anterior right leg.  She does have some varicose veins there and therefore sent for vascular evaluation.  She is unaware of any history of DVT or phlebitis.  Of note her symptoms in the right leg recently have improved significantly.  She does not wear compression stockings.  She does elevate her legs some which does help with her symptoms.  She is otherwise very healthy.  Past Medical History:  Diagnosis Date  . Clostridium difficile diarrhea   . LBBB (left bundle branch block)    02/2014  . Osteopenia    may be starting the prolia injection/in between osteopenia and osteoporosis    Family History  Problem Relation Age of Onset  . Thyroid disease Mother   . Breast cancer Maternal Aunt 60  . Heart disease Maternal Grandmother   . Heart attack Paternal Grandmother   . Heart attack Brother   . Hodgkin's lymphoma Son 46  . Colon cancer Cousin     SOCIAL HISTORY: Social History   Socioeconomic History  . Marital status: Widowed    Spouse name: Not on file  . Number of children: Not on file  . Years of education: Not on file  . Highest education level: Not on file  Occupational History  . Not on file  Social Needs  . Financial resource strain: Not on file  . Food insecurity:    Worry: Not on file   Inability: Not on file  . Transportation needs:    Medical: Not on file    Non-medical: Not on file  Tobacco Use  . Smoking status: Never Smoker  . Smokeless tobacco: Never Used  Substance and Sexual Activity  . Alcohol use: No    Alcohol/week: 0.0 standard drinks  . Drug use: No  . Sexual activity: Not Currently    Partners: Male    Birth control/protection: Surgical    Comment: TAH/BSO  Lifestyle  . Physical activity:    Days per week: Not on file    Minutes per session: Not on file  . Stress: Not on file  Relationships  . Social connections:    Talks on phone: Not on file    Gets together: Not on file    Attends religious service: Not on file    Active member of club or organization: Not on file    Attends meetings of clubs or organizations: Not on file    Relationship status: Not on file  . Intimate partner violence:    Fear of current or ex partner: Not on file    Emotionally abused: Not on file    Physically abused: Not on file    Forced sexual activity: Not on file  Other Topics Concern  . Not  on file  Social History Narrative  . Not on file    Allergies  Allergen Reactions  . Ciprofloxacin Diarrhea  . Darvon Other (See Comments)    Passed out   . Mtx Support [Cobalamin Combinations]     Jitters,shakes    Current Outpatient Medications  Medication Sig Dispense Refill  . acidophilus (RISAQUAD) CAPS capsule Take 1 capsule by mouth daily.    Marland Kitchen ALPRAZolam (XANAX) 0.25 MG tablet TAKE 1 TABLET BY MOUTH ONCE DAILY AT BEDTIME AS NEEDED 30 tablet 0  . aspirin EC 81 MG tablet Take 81 mg by mouth daily as needed.     . Calcium Carbonate-Vit D-Min (CALCIUM 1200 PO) Take by mouth daily.    . Cholecalciferol (VITAMIN D PO) Take 1-2 tablets by mouth.    . estradiol (ESTRACE) 0.1 MG/GM vaginal cream Apply small to urethra twice daily for 7 days. 42.5 g 0  . fluticasone (FLONASE) 50 MCG/ACT nasal spray     . irbesartan (AVAPRO) 75 MG tablet Take 1 tablet by mouth daily.   0  . Multiple Vitamins-Minerals (MULTIVITAMIN WITH MINERALS) tablet Take 1 tablet by mouth daily.    Marland Kitchen omeprazole (PRILOSEC) 20 MG capsule TAKE ONE CAPSULE EVERY DAY     No current facility-administered medications for this visit.     REVIEW OF SYSTEMS:  [X]  denotes positive finding, [ ]  denotes negative finding Cardiac  Comments:  Chest pain or chest pressure:    Shortness of breath upon exertion:    Short of breath when lying flat:    Irregular heart rhythm:        Vascular    Pain in calf, thigh, or hip brought on by ambulation:    Pain in feet at night that wakes you up from your sleep:     Blood clot in your veins:    Leg swelling:         Pulmonary    Oxygen at home:    Productive cough:     Wheezing:         Neurologic    Sudden weakness in arms or legs:     Sudden numbness in arms or legs:     Sudden onset of difficulty speaking or slurred speech:    Temporary loss of vision in one eye:     Problems with dizziness:         Gastrointestinal    Blood in stool:     Vomited blood:         Genitourinary    Burning when urinating:     Blood in urine:        Psychiatric    Major depression:         Hematologic    Bleeding problems:    Problems with blood clotting too easily:        Skin    Rashes or ulcers:        Constitutional    Fever or chills:     PHYSICAL EXAM:   Vitals:   09/30/18 1403  BP: (!) 143/66  Pulse: 67  Resp: 18  Temp: 97.6 F (36.4 C)  TempSrc: Oral  SpO2: 100%  Weight: 137 lb 9.6 oz (62.4 kg)  Height: 5\' 3"  (1.6 m)    GENERAL: The patient is a well-nourished female, in no acute distress. The vital signs are documented above. CARDIAC: There is a regular rate and rhythm.  VASCULAR: I do not detect carotid bruits. She has palpable pedal pulses  bilaterally. She does varicose veins that extend along the lateral aspect of her right thigh onto her right leg laterally.  She also has some varicose veins in her anterior right leg.   She does not have significant leg swelling or hyperpigmentation.  PULMONARY: There is good air exchange bilaterally without wheezing or rales. ABDOMEN: Soft and non-tender with normal pitched bowel sounds.  MUSCULOSKELETAL: There are no major deformities or cyanosis. NEUROLOGIC: No focal weakness or paresthesias are detected. SKIN: There are no ulcers or rashes noted. PSYCHIATRIC: The patient has a normal affect.  DATA:    VENOUS DUPLEX: I have independently interpreted her venous duplex scan today.  She does have some reflux in the right great saphenous vein but the vein is not especially dilated.  There is also some reflux in the common femoral vein on the right.  There is no evidence of DVT or superficial thrombophlebitis.   ASSESSMENT & PLAN:   VARICOSE VEINS RIGHT LOWER EXTREMITY: This patient does have some varicose veins along the lateral aspect of her right thigh and leg but they are not especially symptomatic.  She was having some symptoms in her anterior right leg but the symptoms have essentially resolved.  She does have some reflux in the right great saphenous vein but the vein is not especially dilated so I do not think she is a candidate for endovenous laser ablation.  I have discussed with her conservative treatment for her varicose veins.  I discussed the importance of intermittent leg elevation the proper positioning for this.  I have written her prescription for knee-high compression stockings with a gradient of 15 to 20 mmHg.  I encouraged her to stay active.  I encouraged her to avoid prolonged sitting and standing.  If her symptoms her varicose veins progress in the future then certainly she can be reevaluated.   Deitra Mayo [facility] [phone]

## 2018-12-01 DIAGNOSIS — H9202 Otalgia, left ear: Secondary | ICD-10-CM | POA: Diagnosis not present

## 2018-12-01 DIAGNOSIS — M2669 Other specified disorders of temporomandibular joint: Secondary | ICD-10-CM | POA: Diagnosis not present

## 2018-12-01 DIAGNOSIS — M542 Cervicalgia: Secondary | ICD-10-CM | POA: Diagnosis not present

## 2018-12-02 ENCOUNTER — Other Ambulatory Visit: Payer: Self-pay

## 2018-12-02 NOTE — Telephone Encounter (Signed)
Medication refill request: Alprazolam  Last AEX:  02/23/18 Next AEX: 06/08/19 Last MMG (if hormonal medication request): 09/21/18  Bi-rads 1 neg  Refill authorized: #30 with 2 rf

## 2018-12-04 MED ORDER — ALPRAZOLAM 0.25 MG PO TABS: ORAL_TABLET | ORAL | 1 refills | Status: DC

## 2019-01-06 DIAGNOSIS — M810 Age-related osteoporosis without current pathological fracture: Secondary | ICD-10-CM | POA: Diagnosis not present

## 2019-01-06 DIAGNOSIS — E7849 Other hyperlipidemia: Secondary | ICD-10-CM | POA: Diagnosis not present

## 2019-01-06 DIAGNOSIS — E040 Nontoxic diffuse goiter: Secondary | ICD-10-CM | POA: Diagnosis not present

## 2019-01-06 DIAGNOSIS — I10 Essential (primary) hypertension: Secondary | ICD-10-CM | POA: Diagnosis not present

## 2019-01-12 DIAGNOSIS — Z1331 Encounter for screening for depression: Secondary | ICD-10-CM | POA: Diagnosis not present

## 2019-01-12 DIAGNOSIS — M1990 Unspecified osteoarthritis, unspecified site: Secondary | ICD-10-CM | POA: Diagnosis not present

## 2019-01-12 DIAGNOSIS — I447 Left bundle-branch block, unspecified: Secondary | ICD-10-CM | POA: Diagnosis not present

## 2019-01-12 DIAGNOSIS — Z0000 Encounter for general adult medical examination without abnormal findings: Secondary | ICD-10-CM | POA: Diagnosis not present

## 2019-01-12 DIAGNOSIS — F419 Anxiety disorder, unspecified: Secondary | ICD-10-CM | POA: Diagnosis not present

## 2019-01-12 DIAGNOSIS — E040 Nontoxic diffuse goiter: Secondary | ICD-10-CM | POA: Diagnosis not present

## 2019-01-12 DIAGNOSIS — Z1339 Encounter for screening examination for other mental health and behavioral disorders: Secondary | ICD-10-CM | POA: Diagnosis not present

## 2019-01-12 DIAGNOSIS — I10 Essential (primary) hypertension: Secondary | ICD-10-CM | POA: Diagnosis not present

## 2019-01-12 DIAGNOSIS — K589 Irritable bowel syndrome without diarrhea: Secondary | ICD-10-CM | POA: Diagnosis not present

## 2019-01-12 DIAGNOSIS — M810 Age-related osteoporosis without current pathological fracture: Secondary | ICD-10-CM | POA: Diagnosis not present

## 2019-01-12 DIAGNOSIS — J309 Allergic rhinitis, unspecified: Secondary | ICD-10-CM | POA: Diagnosis not present

## 2019-01-12 DIAGNOSIS — E785 Hyperlipidemia, unspecified: Secondary | ICD-10-CM | POA: Diagnosis not present

## 2019-04-08 ENCOUNTER — Other Ambulatory Visit: Payer: Self-pay | Admitting: Obstetrics & Gynecology

## 2019-04-08 NOTE — Telephone Encounter (Signed)
Medication refill request: Xanax  Last AEX:  02-23-18 SM  Next AEX: 06-08-2019 Last MMG (if hormonal medication request): n/a Refill authorized: Today, please advise.   Medication pended for #30, 1 RF. Please refill if appropriate.

## 2019-06-04 ENCOUNTER — Other Ambulatory Visit: Payer: Self-pay

## 2019-06-08 ENCOUNTER — Other Ambulatory Visit: Payer: Self-pay

## 2019-06-08 ENCOUNTER — Encounter: Payer: Self-pay | Admitting: Obstetrics & Gynecology

## 2019-06-08 ENCOUNTER — Ambulatory Visit (INDEPENDENT_AMBULATORY_CARE_PROVIDER_SITE_OTHER): Payer: PPO | Admitting: Obstetrics & Gynecology

## 2019-06-08 VITALS — BP 158/92 | HR 72 | Temp 98.0°F | Ht 61.5 in | Wt 138.8 lb

## 2019-06-08 DIAGNOSIS — Z01419 Encounter for gynecological examination (general) (routine) without abnormal findings: Secondary | ICD-10-CM | POA: Diagnosis not present

## 2019-06-08 NOTE — Progress Notes (Signed)
78 y.o. G3P3 Widowed White or Caucasian female here for annual exam.  Doing well.  Denies vaginal bleeding.  Denies vaginal discharge or itching.  No GI issues.    She is working on stopping the Xanax.  She is taking 1/4 tab nightly.    PCP:  Dr. Dagmar Hait.  Last appt was a virtual visit and did blood work prior to "appt".  Reports blood work was all good.  Cholesterol was better.    Patient's last menstrual period was 09/16/1993.          Sexually active: No.  The current method of family planning is status post hysterectomy.    Exercising: Yes.    weights and other Smoker:  no  Health Maintenance: Pap:  11/18/16 Neg   09/13/14 Neg  History of abnormal Pap:  no MMG:  09/21/18 BIRADS1:neg  Colonoscopy:  Cologuard 03/31/18 Neg   BMD:   12/02/16 TDaP:  2017 Pneumonia vaccine(s):  Done  Shingrix:  Not sure she wants to get this right now. Hep C testing: n/a Screening Labs: PCP   reports that she has never smoked. She has never used smokeless tobacco. She reports that she does not drink alcohol or use drugs.  Past Medical History:  Diagnosis Date  . Clostridium difficile diarrhea   . LBBB (left bundle branch block)    02/2014  . Osteopenia    may be starting the prolia injection/in between osteopenia and osteoporosis    Past Surgical History:  Procedure Laterality Date  . ABDOMINAL HYSTERECTOMY     TAH/BSO  . APPENDECTOMY    . BREAST BIOPSY Right    benign  . TUBAL LIGATION      Current Outpatient Medications  Medication Sig Dispense Refill  . acidophilus (RISAQUAD) CAPS capsule Take 1 capsule by mouth daily.    Marland Kitchen ALPRAZolam (XANAX) 0.25 MG tablet TAKE 1 TABLET BY MOUTH ONCE DAILY AT BEDTIME AS NEEDED 30 tablet 0  . Calcium Carbonate-Vit D-Min (CALCIUM 1200 PO) Take by mouth daily.    . Cholecalciferol (VITAMIN D PO) Take 1-2 tablets by mouth.    . estradiol (ESTRACE) 0.1 MG/GM vaginal cream Apply small to urethra twice daily for 7 days. 42.5 g 0  . fluticasone (FLONASE) 50  MCG/ACT nasal spray     . ibuprofen (ADVIL) 200 MG tablet Take 200 mg by mouth every 6 (six) hours as needed.    . irbesartan (AVAPRO) 75 MG tablet Take 1 tablet by mouth daily.  0  . methocarbamol (ROBAXIN) 500 MG tablet TAKE 1 TABLET BY MOUTH UP TO 3 TIMES A DAY AS NEEDED FOR MUSCLE PAIN    . Multiple Vitamins-Minerals (MULTIVITAMIN WITH MINERALS) tablet Take 1 tablet by mouth daily.    Marland Kitchen omeprazole (PRILOSEC) 20 MG capsule TAKE ONE CAPSULE EVERY DAY    . aspirin EC 81 MG tablet Take 81 mg by mouth daily as needed.      No current facility-administered medications for this visit.     Family History  Problem Relation Age of Onset  . Thyroid disease Mother   . Breast cancer Maternal Aunt 60  . Heart disease Maternal Grandmother   . Heart attack Paternal Grandmother   . Heart attack Brother   . Hodgkin's lymphoma Son 8  . Colon cancer Cousin     Review of Systems  All other systems reviewed and are negative.   Exam:   BP (!) 158/92   Pulse 72   Temp 98 F (36.7 C) (  Temporal)   Ht 5' 1.5" (1.562 m)   Wt 138 lb 12.8 oz (63 kg)   LMP 09/16/1993   BMI 25.80 kg/m   Height:   Height: 5' 1.5" (156.2 cm)  Ht Readings from Last 3 Encounters:  06/08/19 5' 1.5" (1.562 m)  09/30/18 5\' 3"  (1.6 m)  07/30/18 5\' 2"  (1.575 m)    General appearance: alert, cooperative and appears stated age Head: Normocephalic, without obvious abnormality, atraumatic Neck: no adenopathy, supple, symmetrical, trachea midline and thyroid normal to inspection and palpation Lungs: clear to auscultation bilaterally Breasts: normal appearance, no masses or tenderness Heart: regular rate and rhythm Abdomen: soft, non-tender; bowel sounds normal; no masses,  no organomegaly Extremities: extremities normal, atraumatic, no cyanosis or edema Skin: Skin color, texture, turgor normal. No rashes or lesions Lymph nodes: Cervical, supraclavicular, and axillary nodes normal. No abnormal inguinal nodes  palpated Neurologic: Grossly normal   Pelvic: External genitalia:  no lesions              Urethra:  normal appearing urethra with no masses, tenderness or lesions              Bartholins and Skenes: normal                 Vagina: normal appearing vagina with normal color and discharge, no lesions              Cervix: absent              Pap taken: No. Bimanual Exam:  Uterus:  uterus absent              Adnexa: no mass, fullness, tenderness               Rectovaginal: Confirms               Anus:  normal sphincter tone, no lesions  Chaperone was present for exam.  A:  Well Woman with normal exam PMP, no HRT H/O TAH/BSO Osteoporosis.  Last prolia injection was 5/18. Left BBB H/o c. Diff colitis 2013 Anxiety  P:   Mammogram guidelines reviewed pap smear not indicated BMD discussed Lab work done this summer with Dr. Dagmar Hait Working on weaning off Xanax.  Does not need rx. Declines shingrix vaccination at this time She is planning on getting a flu shot Return annually or prn

## 2019-07-06 DIAGNOSIS — E785 Hyperlipidemia, unspecified: Secondary | ICD-10-CM | POA: Diagnosis not present

## 2019-07-06 DIAGNOSIS — M1990 Unspecified osteoarthritis, unspecified site: Secondary | ICD-10-CM | POA: Diagnosis not present

## 2019-07-06 DIAGNOSIS — K219 Gastro-esophageal reflux disease without esophagitis: Secondary | ICD-10-CM | POA: Diagnosis not present

## 2019-07-06 DIAGNOSIS — K589 Irritable bowel syndrome without diarrhea: Secondary | ICD-10-CM | POA: Diagnosis not present

## 2019-07-06 DIAGNOSIS — M810 Age-related osteoporosis without current pathological fracture: Secondary | ICD-10-CM | POA: Diagnosis not present

## 2019-07-06 DIAGNOSIS — F419 Anxiety disorder, unspecified: Secondary | ICD-10-CM | POA: Diagnosis not present

## 2019-07-06 DIAGNOSIS — I10 Essential (primary) hypertension: Secondary | ICD-10-CM | POA: Diagnosis not present

## 2019-07-06 DIAGNOSIS — J309 Allergic rhinitis, unspecified: Secondary | ICD-10-CM | POA: Diagnosis not present

## 2019-07-27 DIAGNOSIS — M549 Dorsalgia, unspecified: Secondary | ICD-10-CM | POA: Diagnosis not present

## 2019-09-29 ENCOUNTER — Telehealth: Payer: Self-pay | Admitting: Obstetrics & Gynecology
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
## 2019-09-29 NOTE — Telephone Encounter (Signed)
Patient is asking for a new prescription for Xanax. She states Dr.Miller prescribed this in the past. [facility] [address].

## 2019-10-01 NOTE — Telephone Encounter (Signed)
Patient checking status of request for xanax.

## 2019-10-04 ENCOUNTER — Other Ambulatory Visit: Payer: Self-pay | Admitting: Obstetrics & Gynecology

## 2019-10-04 MED ORDER — ALPRAZOLAM 0.25 MG PO TABS: ORAL_TABLET | ORAL | 0 refills | Status: DC

## 2019-10-04 NOTE — Telephone Encounter (Signed)
Dr. Sabra Heck -please review refill request. Is OV needed to further discuss?

## 2019-10-04 NOTE — Telephone Encounter (Signed)
This is fine.  I'm sorry.  I thought I did this before leaving on Friday.

## 2019-10-04 NOTE — Telephone Encounter (Signed)
Call to patient, left detailed message, ok per dpr. Advised [facility] [place]. Return call to office if any additional questions.   Encounter closed.

## 2019-10-05 ENCOUNTER — Other Ambulatory Visit: Payer: Self-pay | Admitting: Obstetrics & Gynecology

## 2019-10-05 DIAGNOSIS — Z1231 Encounter for screening mammogram for malignant neoplasm of breast: Secondary | ICD-10-CM

## 2019-10-07 ENCOUNTER — Ambulatory Visit: Payer: PPO | Attending: Internal Medicine

## 2019-10-07 DIAGNOSIS — Z23 Encounter for immunization: Secondary | ICD-10-CM | POA: Insufficient documentation

## 2019-10-07 NOTE — Progress Notes (Signed)
   Covid-19 Vaccination Clinic  Name:  Sylvia Kennedy    MRN: [id number] DOB: March 25, 1941  10/07/2019  [name] was observed post Covid-19 immunization for 15 minutes without incidence. She was provided with Vaccine Information Sheet and instruction to access the V-Safe system.   [name] was instructed to call 911 with any severe reactions post vaccine: Marland Kitchen Difficulty breathing  . Swelling of your face and throat  . A fast heartbeat  . A bad rash all over your body  . Dizziness and weakness    Immunizations Administered    Name Date Dose VIS Date Route   Pfizer COVID-19 Vaccine 10/07/2019  1:20 PM 0.3 mL 08/27/2019 Intramuscular   Manufacturer: [place]   Lot: [id number]   [place]: [id number]

## 2019-10-11 DIAGNOSIS — M47812 Spondylosis without myelopathy or radiculopathy, cervical region: Secondary | ICD-10-CM | POA: Diagnosis not present

## 2019-10-11 DIAGNOSIS — M542 Cervicalgia: Secondary | ICD-10-CM | POA: Diagnosis not present

## 2019-10-11 DIAGNOSIS — M47896 Other spondylosis, lumbar region: Secondary | ICD-10-CM | POA: Diagnosis not present

## 2019-10-11 DIAGNOSIS — M545 Low back pain: Secondary | ICD-10-CM | POA: Diagnosis not present

## 2019-10-28 ENCOUNTER — Ambulatory Visit: Payer: PPO | Attending: Internal Medicine

## 2019-10-28 ENCOUNTER — Ambulatory Visit: Payer: Self-pay

## 2019-10-28 DIAGNOSIS — Z23 Encounter for immunization: Secondary | ICD-10-CM | POA: Insufficient documentation

## 2019-10-28 NOTE — Telephone Encounter (Signed)
Patient called stating that last night she had a bout of diarrhea and vomiting. She states it lasted 3 hours.  Today she has eaten a boiled egg without nausea of diarrhea.  She has been drinking ginger ale  since about 1230am. Still no diarrhea or nausea.  She denies abdominal pain, cramping.  She has no fever.  She states that she has hx of these same symptoms and has see her physician many times for this. She need to know if she will be ok to receive her 2nd dose of COVID-19 vaccine. Per protocol patient was asked to reach out to he PCP for advice. She verbalized understanding. Reason for Disposition . [1] Caller requesting NON-URGENT health information AND [2] PCP's office is the best resource  Answer Assessment - Initial Assessment Questions 1. REASON FOR CALL or QUESTION: "What is your reason for calling today?" or "How can I best help you?" or "What question do you have that I can help answer?"     Had up set stomach 3 hours last night. She has appointment for 2nd dose of COVID-19 vaccine today. Should she go?  Protocols used: INFORMATION ONLY CALL - NO TRIAGE-A-AH

## 2019-10-28 NOTE — Progress Notes (Signed)
   Covid-19 Vaccination Clinic  Name:  NELLI LARIN    MRN: [id number] DOB: 02-12-1941  10/28/2019  Ms. Zimmerle was observed post Covid-19 immunization for 15 minutes without incidence. She was provided with Vaccine Information Sheet and instruction to access the V-Safe system.   Ms. Strzalka was instructed to call 911 with any severe reactions post vaccine: Marland Kitchen Difficulty breathing  . Swelling of your face and throat  . A fast heartbeat  . A bad rash all over your body  . Dizziness and weakness    Immunizations Administered    Name Date Dose VIS Date Route   Pfizer COVID-19 Vaccine 10/28/2019 12:49 PM 0.3 mL 08/27/2019 Intramuscular   Manufacturer: [place]   Lot: [id number]   [place]: [id number]

## 2019-11-05 ENCOUNTER — Telehealth: Payer: Self-pay | Admitting: *Deleted

## 2019-11-05 NOTE — Telephone Encounter (Signed)
Patient would like to discuss anxiety medication with Dr Sabra Heck.

## 2019-11-05 NOTE — Telephone Encounter (Signed)
Spoke with patient. Patient is requesting an OV to discuss anxiety. Patient is currently on xanax, continues to experience anxiety when driving. Xanax "takes the edge off". Patient is requesting to further discuss with Dr. Sabra Heck, is aware she may be recommended to establish with counselor or psychiatrist. OV scheduled for 11/11/19 at 4:30pm with Dr. Sabra Heck.   Routing to provider for final review. Patient is agreeable to disposition. Will close encounter.

## 2019-11-09 ENCOUNTER — Other Ambulatory Visit: Payer: Self-pay

## 2019-11-09 ENCOUNTER — Ambulatory Visit
Admission: RE | Admit: 2019-11-09 | Discharge: 2019-11-09 | Disposition: A | Discharge status: Home / Self Care | Payer: PPO | Source: Ambulatory Visit | Attending: Obstetrics & Gynecology | Admitting: Obstetrics & Gynecology

## 2019-11-09 DIAGNOSIS — Z1231 Encounter for screening mammogram for malignant neoplasm of breast: Secondary | ICD-10-CM

## 2019-11-10 ENCOUNTER — Other Ambulatory Visit: Payer: Self-pay

## 2019-11-11 ENCOUNTER — Encounter: Payer: Self-pay | Admitting: Obstetrics & Gynecology

## 2019-11-11 ENCOUNTER — Ambulatory Visit (INDEPENDENT_AMBULATORY_CARE_PROVIDER_SITE_OTHER): Payer: PPO | Admitting: Obstetrics & Gynecology

## 2019-11-11 VITALS — BP 122/68 | HR 76 | Temp 97.5°F | Resp 10 | Ht 61.5 in | Wt 144.0 lb

## 2019-11-11 DIAGNOSIS — F4310 Post-traumatic stress disorder, unspecified: Secondary | ICD-10-CM

## 2019-11-11 DIAGNOSIS — Z0001 Encounter for general adult medical examination with abnormal findings: Secondary | ICD-10-CM | POA: Diagnosis not present

## 2019-11-11 DIAGNOSIS — F419 Anxiety disorder, unspecified: Secondary | ICD-10-CM | POA: Diagnosis not present
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
## 2019-12-03 ENCOUNTER — Telehealth: Payer: Self-pay | Admitting: Obstetrics & Gynecology

## 2019-12-03 NOTE — Telephone Encounter (Signed)
Patient wants to speak with the nurse. She states she would like to start the medication that Dr. Sabra Heck recommended at her last visit. [facility].

## 2019-12-03 NOTE — Telephone Encounter (Signed)
Spoke with patient. Patient was seen in the office on 11/11/19 for PTSD, discussed different tx options. Patient has since stopped xanax and is scheduled to see Caroline Sauger, LCSW on 4/9 for her first appt. Patient is requesting to start medication that was discussed at [place], SSRI. [facility]   Advised patient I will send request to Dr. Sabra Heck and f/u with recommendations, patient is aware response may not be immediate and is agreeable.   Dr. Sabra Heck -please review and advise on Rx request.

## 2019-12-06 DIAGNOSIS — R0981 Nasal congestion: Secondary | ICD-10-CM | POA: Diagnosis not present

## 2019-12-06 NOTE — Telephone Encounter (Signed)
I would recommend starting Paxil.  The starting dose is typically 20mg  but in this situation lower starting doses are recommended.  I would recommend she start with 5mg  daily and take this for 3- 4 weeks before increasing dosage to 10mg .  I would like to recheck with her in 4 weeks.

## 2019-12-07 NOTE — Telephone Encounter (Signed)
Spoke with patient. Advised per Dr. Sabra Heck. Patient states she has decided to hold off on starting any new medication at this time, wants to wait and see counselor on 4/9 and further discuss.   Patient asked what the side effects of Paxil are, reviewed common side effects from Up To Date with patient.   Patient will return call to office if she desires Rx. Patient thankful for follow up.   No Rx sent.   Routing to provider for final review. Patient is agreeable to disposition. Will close encounter.

## 2019-12-21 ENCOUNTER — Ambulatory Visit (INDEPENDENT_AMBULATORY_CARE_PROVIDER_SITE_OTHER): Payer: PPO | Admitting: Psychology

## 2019-12-21 DIAGNOSIS — F4310 Post-traumatic stress disorder, unspecified: Secondary | ICD-10-CM | POA: Diagnosis not present

## 2020-01-04 ENCOUNTER — Ambulatory Visit (INDEPENDENT_AMBULATORY_CARE_PROVIDER_SITE_OTHER): Payer: PPO | Admitting: Psychology

## 2020-01-04 DIAGNOSIS — F4310 Post-traumatic stress disorder, unspecified: Secondary | ICD-10-CM

## 2020-01-10 ENCOUNTER — Ambulatory Visit: Payer: PPO | Admitting: Psychology

## 2020-01-13 DIAGNOSIS — M810 Age-related osteoporosis without current pathological fracture: Secondary | ICD-10-CM | POA: Diagnosis not present

## 2020-01-13 DIAGNOSIS — E038 Other specified hypothyroidism: Secondary | ICD-10-CM | POA: Diagnosis not present

## 2020-01-13 DIAGNOSIS — E7849 Other hyperlipidemia: Secondary | ICD-10-CM | POA: Diagnosis not present

## 2020-01-18 ENCOUNTER — Ambulatory Visit: Payer: PPO | Admitting: Psychology

## 2020-01-20 DIAGNOSIS — F419 Anxiety disorder, unspecified: Secondary | ICD-10-CM | POA: Diagnosis not present

## 2020-01-20 DIAGNOSIS — I8390 Asymptomatic varicose veins of unspecified lower extremity: Secondary | ICD-10-CM | POA: Diagnosis not present

## 2020-01-20 DIAGNOSIS — Z0000 Encounter for general adult medical examination without abnormal findings: Secondary | ICD-10-CM | POA: Diagnosis not present

## 2020-01-20 DIAGNOSIS — E785 Hyperlipidemia, unspecified: Secondary | ICD-10-CM | POA: Diagnosis not present

## 2020-01-20 DIAGNOSIS — I447 Left bundle-branch block, unspecified: Secondary | ICD-10-CM | POA: Diagnosis not present

## 2020-01-20 DIAGNOSIS — R82998 Other abnormal findings in urine: Secondary | ICD-10-CM | POA: Diagnosis not present

## 2020-01-20 DIAGNOSIS — M810 Age-related osteoporosis without current pathological fracture: Secondary | ICD-10-CM | POA: Diagnosis not present

## 2020-01-20 DIAGNOSIS — K219 Gastro-esophageal reflux disease without esophagitis: Secondary | ICD-10-CM | POA: Diagnosis not present

## 2020-01-20 DIAGNOSIS — M1990 Unspecified osteoarthritis, unspecified site: Secondary | ICD-10-CM | POA: Diagnosis not present

## 2020-01-20 DIAGNOSIS — E040 Nontoxic diffuse goiter: Secondary | ICD-10-CM | POA: Diagnosis not present

## 2020-01-20 DIAGNOSIS — I10 Essential (primary) hypertension: Secondary | ICD-10-CM | POA: Diagnosis not present

## 2020-01-20 DIAGNOSIS — K589 Irritable bowel syndrome without diarrhea: Secondary | ICD-10-CM | POA: Diagnosis not present

## 2020-01-20 DIAGNOSIS — J309 Allergic rhinitis, unspecified: Secondary | ICD-10-CM | POA: Diagnosis not present

## 2020-02-04 ENCOUNTER — Other Ambulatory Visit: Payer: Self-pay | Admitting: Obstetrics & Gynecology

## 2020-02-04 NOTE — Telephone Encounter (Signed)
Medication refill request: Xanax Last AEX:  06-08-2019 SM  Next AEX: 08-22-20 Last MMG (if hormonal medication request): n/a Refill authorized: Today, please advise.   Medication pended for #20, 0RF. Please refill if appropriate.

## 2020-02-08 DIAGNOSIS — R194 Change in bowel habit: Secondary | ICD-10-CM | POA: Diagnosis not present

## 2020-02-08 DIAGNOSIS — K760 Fatty (change of) liver, not elsewhere classified: Secondary | ICD-10-CM | POA: Diagnosis not present

## 2020-02-08 DIAGNOSIS — K449 Diaphragmatic hernia without obstruction or gangrene: Secondary | ICD-10-CM | POA: Diagnosis not present

## 2020-02-08 DIAGNOSIS — K219 Gastro-esophageal reflux disease without esophagitis: Secondary | ICD-10-CM | POA: Diagnosis not present

## 2020-03-03 ENCOUNTER — Other Ambulatory Visit: Payer: Self-pay

## 2020-03-06 ENCOUNTER — Other Ambulatory Visit: Payer: Self-pay

## 2020-03-06 ENCOUNTER — Ambulatory Visit: Payer: PPO | Admitting: Psychology

## 2020-03-06 DIAGNOSIS — F4310 Post-traumatic stress disorder, unspecified: Secondary | ICD-10-CM

## 2020-03-16 ENCOUNTER — Other Ambulatory Visit: Payer: Self-pay | Admitting: Obstetrics & Gynecology

## 2020-03-16 NOTE — Telephone Encounter (Signed)
Medication refill request: xanax 0.25mg  Last AEX:  06-08-2019 Next AEX: 08-22-2020 Last MMG (if hormonal medication request): n/a Refill authorized: please approve if appropriate

## 2020-03-24 ENCOUNTER — Telehealth: Payer: Self-pay | Admitting: Obstetrics & Gynecology

## 2020-03-24 ENCOUNTER — Encounter: Payer: Self-pay | Admitting: Obstetrics & Gynecology

## 2020-03-24 ENCOUNTER — Other Ambulatory Visit: Payer: Self-pay

## 2020-03-24 ENCOUNTER — Ambulatory Visit: Payer: PPO | Admitting: Obstetrics & Gynecology

## 2020-03-24 VITALS — BP 120/70 | HR 68 | Temp 97.5°F | Resp 16 | Wt 143.0 lb

## 2020-03-24 DIAGNOSIS — N390 Urinary tract infection, site not specified: Secondary | ICD-10-CM | POA: Diagnosis not present

## 2020-03-24 DIAGNOSIS — N898 Other specified noninflammatory disorders of vagina: Secondary | ICD-10-CM

## 2020-03-24 DIAGNOSIS — R102 Pelvic and perineal pain: Secondary | ICD-10-CM

## 2020-03-24 LAB — POCT URINALYSIS DIPSTICK
Bilirubin, UA: NEGATIVE
Blood, UA: NEGATIVE
Glucose, UA: NEGATIVE
Ketones, UA: NEGATIVE
Nitrite, UA: NEGATIVE
Protein, UA: NEGATIVE
Urobilinogen, UA: NEGATIVE E.U./dL — AB
pH, UA: 5 (ref 5.0–8.0)

## 2020-03-24 NOTE — Telephone Encounter (Signed)
Patient wants to speak with the nurse. Patient wants to come in today. She states she is having pressure in her stomach and pain around her tail bone.

## 2020-03-24 NOTE — Progress Notes (Signed)
GYNECOLOGY  VISIT  CC:   RLQ pain  HPI: 79 y.o. G3P3 Widowed White or Caucasian female here for abdominal cramping that started about 5 or 6 weeks ago.  Reports she had severe abdominal pain that was followed by several episodes of diarrhea.  The pain went away but then started having constipation.  She ended up date metamucil for several days and this helped.  She saw Dr. Collene Mares.  Pt reports no testing was done.  Patient was given a food diary to do but she hasn't had another episode of diarrhea.  Reports she's having a little vaginal discharge.  Denies vaginal bleeding.  She is having upper back pain that is underneath her right shoulder blade.  Denies fever.    She denies urinary urgency, dysuria or frequency.  She just had a bloated feeling.    poct urine-wbc tr  GYNECOLOGIC HISTORY: Patient's last menstrual period was 09/16/1993. Contraception: hysterectomy Menopausal hormone therapy: estrace vaginal cream  Patient Active Problem List   Diagnosis Date Noted  . 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 BBB (bundle branch block) 07/27/2015  . Gastroenteritis 11/21/2014  . Paresthesia 03/25/2014  . Neck pain 02/14/2014    Past Medical History:  Diagnosis Date  . Clostridium difficile diarrhea   . LBBB (left bundle branch block)    02/2014  . Osteopenia    may be starting the prolia injection/in between osteopenia and osteoporosis    Past Surgical History:  Procedure Laterality Date  . ABDOMINAL HYSTERECTOMY     TAH/BSO  . APPENDECTOMY    . BREAST BIOPSY Right    benign  . TUBAL LIGATION      MEDS:   Current Outpatient Medications on File Prior to Visit  Medication Sig Dispense Refill  . acidophilus (RISAQUAD) CAPS capsule Take 1 capsule by mouth.     . ALPRAZolam (XANAX) 0.25 MG tablet TAKE 1/2 TO 1 TABLET EVERY DAY AS NEEDED NO MORE THAN ONCE DAILY 20 tablet 0  . estradiol (ESTRACE) 0.1 MG/GM vaginal cream Apply small to urethra twice daily for 7 days. 42.5 g 0   . fluticasone (FLONASE) 50 MCG/ACT nasal spray     . irbesartan (AVAPRO) 75 MG tablet Take 1 tablet by mouth daily.  0  . Multiple Vitamins-Minerals (MULTIVITAMIN WITH MINERALS) tablet Take 1 tablet by mouth daily.    Marland Kitchen omeprazole (PRILOSEC) 20 MG capsule TAKE ONE CAPSULE EVERY DAY     No current facility-administered medications on file prior to visit.    ALLERGIES: Ciprofloxacin, Darvon, and Mtx support [cobalamin combinations]  Family History  Problem Relation Age of Onset  . Thyroid disease Mother   . Breast cancer Maternal Aunt 60  . Heart disease Maternal Grandmother   . Heart attack Paternal Grandmother   . Heart attack Brother   . Hodgkin's lymphoma Son 82  . Colon cancer Cousin     SH:  Widowed, non smoker  Review of Systems  Constitutional: Negative.   HENT: Negative.   Eyes: Negative.   Respiratory: Negative.   Cardiovascular: Negative.   Gastrointestinal: Negative.   Endocrine: Negative.   Genitourinary:       Pelvic pain that radiates to lower back & left shoulder  Musculoskeletal: Negative.   Skin: Negative.   Allergic/Immunologic: Negative.   Neurological: Negative.   Hematological: Negative.   Psychiatric/Behavioral: Negative.     PHYSICAL EXAMINATION:    BP 120/70   Pulse 68   Temp (!) 97.5 F (36.4 C) (Skin)  Resp 16   Wt 143 lb (64.9 kg)   LMP 09/16/1993   BMI 26.58 kg/m     General appearance: alert, cooperative and appears stated age Abdomen: soft, mild RLQ pain; bowel sounds normal; no masses,  no organomegaly Lymph:  no inguinal LAD noted  Pelvic: External genitalia:  no lesions              Urethra:  normal appearing urethra with no masses, tenderness or lesions              Bartholins and Skenes: normal                 Vagina: normal appearing vagina with normal color and discharge, no lesions              Cervix: absent              Bimanual Exam:  Uterus:  uterus absent              Adnexa: no mass, fullness, tenderness               Rectovaginal: Yes.  .  Confirms.              Anus:  normal sphincter tone, no lesions  Chaperone, Royal Hawthorn, CMA, was present for exam.  Assessment: RLQ pain with diarrhea H/o c diff colitis  Scant vaginal discharge Pelvic cramping  Plan: Urine culture obtained Affirm pending If these are negative, feel she needs to proceed with additional imaging, possible CT scan.  May need follow up again with GI.

## 2020-03-24 NOTE — Telephone Encounter (Signed)
AEX 12/21 PMP, no HRT H/O TAH/BSO H/o c. Diff colitis 2013  Spoke with pt. Pt states having abd pain/cramping, bloating and pelvic/hip/back pain for last 2-3 weeks. Pt states didn't call earlier, thought it would go away. Pt also states having some vaginal discharge, but no odor. Not wearing any pad or liner.   Denies any vaginal bleeding, fever, chills, all UTI sx, N/V/D. Pt states has not taken any OTC meds for treatment or been seen by PCP.  Pt requesting to be seen. Pt scheduled with Dr Sabra Heck on 7/9 at 4 pm. Pt agreeable and verbalized understanding. CPS neg.   Routing to Dr Sabra Heck for review. Encounter closed.

## 2020-03-25 LAB — VAGINITIS/VAGINOSIS, DNA PROBE
Candida Species: NEGATIVE
Gardnerella vaginalis: NEGATIVE
Trichomonas vaginosis: NEGATIVE

## 2020-03-26 LAB — URINE CULTURE: Organism ID, Bacteria: NO GROWTH

## 2020-03-27 ENCOUNTER — Other Ambulatory Visit: Payer: Self-pay

## 2020-03-27 ENCOUNTER — Ambulatory Visit: Payer: PPO | Admitting: Psychology

## 2020-03-27 DIAGNOSIS — F4310 Post-traumatic stress disorder, unspecified: Secondary | ICD-10-CM

## 2020-03-28 ENCOUNTER — Telehealth: Payer: Self-pay | Admitting: *Deleted

## 2020-03-28 ENCOUNTER — Telehealth: Payer: Self-pay

## 2020-03-28 DIAGNOSIS — R1031 Right lower quadrant pain: Secondary | ICD-10-CM

## 2020-03-28 DIAGNOSIS — Z8619 Personal history of other infectious and parasitic diseases: Secondary | ICD-10-CM

## 2020-03-28 DIAGNOSIS — R197 Diarrhea, unspecified: Secondary | ICD-10-CM

## 2020-03-28 NOTE — Telephone Encounter (Signed)
No voicemail mailbox. Try again.

## 2020-03-28 NOTE — Telephone Encounter (Signed)
-----   Message from Megan Salon, MD sent at 03/26/2020  5:36 AM EDT ----- Please let pt know vaginitis testing is negative. Urine culture is still pending.

## 2020-03-28 NOTE — Telephone Encounter (Signed)
Burnice Logan, RN  03/28/2020 1:25 PM EDT Back to Top    Spoke with patient. Advised as seen below per Dr. Sabra Heck. Patient agreeable to proceed with CT abd/pelvis w/ contrast at [facility]. Advised patient order will be placed, [facility] will contact her directly to schedule, once scheduled our office will precert. Patient verbalizes understanding and is agreeable.   See 03/28/20 telephone encounter.     Order placed for CT abd/pelvis w/ contrast at [facility].  Spoke with Prentiss Bells at [employer]. Patient scheduled for first available on 04/11/20 at 1:50pm, arrive at 1:30pm, [address] location. Patient will need to pick up oral contrast at least 1 day prior to appt. Nothing to eat or drink for 4 hours prior to appt.   Patient notified of appt and instructions. Patient verbalizes understanding and is agreeable.   Routing to [employer]  for [employer].

## 2020-03-28 NOTE — Telephone Encounter (Signed)
-----   Message from Megan Salon, MD sent at 03/28/2020 12:57 PM EDT ----- Please let pt know her urine culture was negative.  She has RLQ pain with hx of c diff colitis and recent diarrhea.  H/o TAH/BSO so I think needs CT abd/pelvis.  Can you see if can get approved?  Thanks.  CC:  Royal Hawthorn, CMA

## 2020-03-29 NOTE — Telephone Encounter (Signed)
Per [employer] Rep Verdene Lennert [phone] (call reference [phone]- 03/29/20@8 :28am EST) Prior approval is not required on this plan for CT Abdomen Pelvis with contrast.

## 2020-03-29 NOTE — Telephone Encounter (Signed)
Per [employer] Rep Verdene Lennert [phone]  (call reference [phone]- 03/29/20@8 :28am EST) Prior approval is not required on this plan for CT Abdomen Pelvis with contrast.

## 2020-03-30 NOTE — Telephone Encounter (Signed)
Patient notified of negative vaginal testing result.

## 2020-04-04 NOTE — Telephone Encounter (Signed)
Routing to Dr. Miller FYI.   Encounter closed.  

## 2020-04-07 DIAGNOSIS — S92412A Displaced fracture of proximal phalanx of left great toe, initial encounter for closed fracture: Secondary | ICD-10-CM | POA: Diagnosis not present

## 2020-04-07 DIAGNOSIS — S92405A Nondisplaced unspecified fracture of left great toe, initial encounter for closed fracture: Secondary | ICD-10-CM | POA: Diagnosis not present

## 2020-04-07 DIAGNOSIS — M79675 Pain in left toe(s): Secondary | ICD-10-CM | POA: Diagnosis not present

## 2020-04-07 DIAGNOSIS — W1839XA Other fall on same level, initial encounter: Secondary | ICD-10-CM | POA: Diagnosis not present

## 2020-04-11 ENCOUNTER — Inpatient Hospital Stay: Admission: RE | Admit: 2020-04-11 | Payer: PPO | Source: Ambulatory Visit

## 2020-04-11 DIAGNOSIS — M79672 Pain in left foot: Secondary | ICD-10-CM | POA: Diagnosis not present

## 2020-04-11 DIAGNOSIS — S92412A Displaced fracture of proximal phalanx of left great toe, initial encounter for closed fracture: Secondary | ICD-10-CM | POA: Diagnosis not present

## 2020-04-22 ENCOUNTER — Other Ambulatory Visit: Payer: Self-pay | Admitting: Obstetrics and Gynecology

## 2020-04-22 DIAGNOSIS — F4310 Post-traumatic stress disorder, unspecified: Secondary | ICD-10-CM

## 2020-04-24 NOTE — Telephone Encounter (Signed)
Please call this patient and check on her. Xanax script was refilled last month.

## 2020-04-24 NOTE — Telephone Encounter (Signed)
Medication refill request: Xanax 0.25mg   Last OV:  03/24/20 Next OV: 08/22/20 Last MMG (if hormonal medication request): 11/09/19  Normal  Refill authorized: 20/0

## 2020-04-25 ENCOUNTER — Other Ambulatory Visit: Payer: PPO

## 2020-04-25 NOTE — Telephone Encounter (Signed)
Spoke with patient. Patient states that she recently fell and broke her foot and is having a hard time sleeping and dealing with it. Has increased to taking Xanax 0.25 mg 1 tablet daily. States she does not want to do this long term. Just trying to get through this time. Patient has 1 day left of medication. Advised Dr.Miller is out of the office this week on vacation and I will review with covering MD.

## 2020-04-25 NOTE — Telephone Encounter (Signed)
Patient is calling to check status of refill. 

## 2020-04-26 DIAGNOSIS — M79672 Pain in left foot: Secondary | ICD-10-CM | POA: Diagnosis not present

## 2020-04-26 DIAGNOSIS — S92402D Displaced unspecified fracture of left great toe, subsequent encounter for fracture with routine healing: Secondary | ICD-10-CM | POA: Diagnosis not present

## 2020-04-26 NOTE — Telephone Encounter (Signed)
Rx called in to pharmacy. 

## 2020-05-08 DIAGNOSIS — S92402D Displaced unspecified fracture of left great toe, subsequent encounter for fracture with routine healing: Secondary | ICD-10-CM | POA: Diagnosis not present

## 2020-05-08 DIAGNOSIS — M79672 Pain in left foot: Secondary | ICD-10-CM | POA: Diagnosis not present

## 2020-05-16 ENCOUNTER — Telehealth: Payer: Self-pay | Admitting: *Deleted

## 2020-05-16 NOTE — Telephone Encounter (Signed)
Left message to call Sharee Pimple, RN at [facility].   Patient is in imaging hold for CT abdomen/pelvis w/ contrast for RLQ pain, Hx c diff colitis, recent diarrhea; Hx TAH/BSO. Ordered on 03/28/20  Imaging has been scheduled and cancelled by patient x3.   Does patient plan to proceed with imaging?

## 2020-05-16 NOTE — Telephone Encounter (Signed)
Spoke with patient. Patient reports she had a fall and broke her toe, she is approaching 6wks now. She plans to proceed with CT scan once she is healed. She will contact [facility] to reschedule when she is ready. Patient thankful for f/u.   Will continue IMG hold.   Routing to provider for final review. Patient is agreeable to disposition. Will close encounter.

## 2020-05-18 ENCOUNTER — Other Ambulatory Visit: Payer: Self-pay | Admitting: Obstetrics & Gynecology

## 2020-05-18 DIAGNOSIS — F4310 Post-traumatic stress disorder, unspecified: Secondary | ICD-10-CM

## 2020-05-18 NOTE — Telephone Encounter (Signed)
Medication refill request: Alprazolam 0.25mg  Last AEX:  06/08/19 Next AEX: 08/22/20 Last MMG (if hormonal medication request): NA Refill authorized: 20/0

## 2020-05-19 DIAGNOSIS — S92402D Displaced unspecified fracture of left great toe, subsequent encounter for fracture with routine healing: Secondary | ICD-10-CM | POA: Diagnosis not present

## 2020-05-19 DIAGNOSIS — M79672 Pain in left foot: Secondary | ICD-10-CM | POA: Diagnosis not present

## 2020-06-02 DIAGNOSIS — S92402D Displaced unspecified fracture of left great toe, subsequent encounter for fracture with routine healing: Secondary | ICD-10-CM | POA: Diagnosis not present

## 2020-06-02 DIAGNOSIS — M79672 Pain in left foot: Secondary | ICD-10-CM | POA: Diagnosis not present

## 2020-06-05 ENCOUNTER — Ambulatory Visit
Admission: RE | Admit: 2020-06-05 | Discharge: 2020-06-05 | Disposition: A | Discharge status: Home / Self Care | Payer: PPO | Source: Ambulatory Visit | Attending: Obstetrics & Gynecology | Admitting: Obstetrics & Gynecology

## 2020-06-05 DIAGNOSIS — R197 Diarrhea, unspecified: Secondary | ICD-10-CM

## 2020-06-05 DIAGNOSIS — R1031 Right lower quadrant pain: Secondary | ICD-10-CM

## 2020-06-05 DIAGNOSIS — Z8619 Personal history of other infectious and parasitic diseases: Secondary | ICD-10-CM

## 2020-06-05 MED ORDER — IOPAMIDOL (ISOVUE-300) INJECTION 61%
100.0000 mL | Freq: Once | INTRAVENOUS | Status: AC | PRN
  Administered 2020-06-05: 100 mL via INTRAVENOUS

## 2020-06-06 DIAGNOSIS — K219 Gastro-esophageal reflux disease without esophagitis: Secondary | ICD-10-CM | POA: Diagnosis not present

## 2020-06-06 DIAGNOSIS — K625 Hemorrhage of anus and rectum: Secondary | ICD-10-CM | POA: Diagnosis not present

## 2020-06-06 DIAGNOSIS — K449 Diaphragmatic hernia without obstruction or gangrene: Secondary | ICD-10-CM | POA: Diagnosis not present

## 2020-06-06 DIAGNOSIS — R140 Abdominal distension (gaseous): Secondary | ICD-10-CM | POA: Diagnosis not present

## 2020-06-06 DIAGNOSIS — K760 Fatty (change of) liver, not elsewhere classified: Secondary | ICD-10-CM | POA: Diagnosis not present

## 2020-06-06 DIAGNOSIS — K5900 Constipation, unspecified: Secondary | ICD-10-CM | POA: Diagnosis not present

## 2020-06-07 ENCOUNTER — Telehealth: Payer: Self-pay | Admitting: Obstetrics & Gynecology

## 2020-06-07 NOTE — Telephone Encounter (Signed)
Per result notes of CT scan:   Burnice Logan, RN  06/06/2020 8:29 AM EDT     Call to patient, left detailed message on home number, ok per dpr.  Advised as seen below per Dr. Sabra Heck. Return call to office if any additional questions/concerns.   Removed from IMG hold.    Megan Salon, MD  06/06/2020 6:14 AM EDT     Please let pt know her CT is negative. Ok to remove from imaging hold. I think she needs to see GI again as this does not appear to by gynecologic in nature. Thanks.

## 2020-06-07 NOTE — Telephone Encounter (Signed)
Patient is returning a call. No open telephone encounter.

## 2020-06-07 NOTE — Telephone Encounter (Signed)
Spoke with pt. Pt given results and recommendations per Dr Sabra Heck. Pt states did not have VM. Pt agreeable and verbalized understanding. Pt states has 4 week f/u with GI.  Encounter closed.

## 2020-06-21 ENCOUNTER — Telehealth: Payer: Self-pay

## 2020-06-21 NOTE — Telephone Encounter (Signed)
Patient noticed 2 small drops of blood in underwear this morning.

## 2020-06-21 NOTE — Telephone Encounter (Signed)
AEX 05/2019, next scheduled for 08/2020 with SM TAH/BSO PMP, no HRT Osteoporosis, last Prolia injection 01/2017  Spoke with pt. Pt states having 2 small spots of blood this morning when woke up. Pt states having mild cramps as well, but thought it was from recent GI issues she is having. Pt states is seeing Dr Collene Mares.  Pt denies fever, chills, UTI sx, heavy vaginal bleeding, discharge, odor or itching.  Pt advised to have OV for further evaluation. Pt agreeable. Pt scheduled for OV on 10/7 at 1130 am with Dr Sabra Heck. Pt verbalized understanding to date and time of appt.  Encounter closed.

## 2020-06-22 ENCOUNTER — Encounter: Payer: Self-pay | Admitting: Obstetrics & Gynecology

## 2020-06-22 ENCOUNTER — Ambulatory Visit: Payer: PPO | Admitting: Obstetrics & Gynecology

## 2020-06-22 ENCOUNTER — Other Ambulatory Visit: Payer: Self-pay

## 2020-06-22 VITALS — BP 118/60 | HR 70 | Resp 16 | Wt 133.0 lb

## 2020-06-22 DIAGNOSIS — N939 Abnormal uterine and vaginal bleeding, unspecified: Secondary | ICD-10-CM | POA: Diagnosis not present

## 2020-06-22 DIAGNOSIS — K6289 Other specified diseases of anus and rectum: Secondary | ICD-10-CM

## 2020-06-22 DIAGNOSIS — R140 Abdominal distension (gaseous): Secondary | ICD-10-CM | POA: Diagnosis not present

## 2020-06-22 LAB — POCT URINALYSIS DIPSTICK
Bilirubin, UA: NEGATIVE
Blood, UA: NEGATIVE
Glucose, UA: NEGATIVE
Ketones, UA: NEGATIVE
Leukocytes, UA: NEGATIVE
Nitrite, UA: NEGATIVE
Protein, UA: NEGATIVE
Urobilinogen, UA: NEGATIVE E.U./dL — AB
pH, UA: 5 (ref 5.0–8.0)

## 2020-06-22 MED ORDER — ESTRADIOL 0.1 MG/GM VA CREA: TOPICAL_CREAM | VAGINAL | 1 refills | Status: AC

## 2020-06-22 NOTE — Progress Notes (Signed)
GYNECOLOGY  VISIT  CC:   PMP bleeding  HPI: 79 y.o. G3P3 Widowed White or Caucasian female here for vaginal bleeding that she noticed on Tuesday morning.  It was in her underwear and was two tiny drops and a little off to the left side.  She has not noted any blood in her urine and denies urinary symptoms.  Pt has hx of TAH/BSO.  She reports she fell and broke and big left toe.  She was non-weightbearing for 2 weeks and then had to be non weight bearing for 3 more weeks.  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  She worried a lot for several days and she started having a lot of increased gas.  Gas is now improved but she is having pain with defecation.  She wonders if stress caused all of this.  She has a lot of rectal pressure.    Separately, [place] and is feeling more comfortable with driving so this has been a positive change.  poct urine-neg  GYNECOLOGIC HISTORY: Patient's last menstrual period was 09/16/1993. Contraception: hysterectomy Menopausal hormone therapy: estrace vaginal cream  Patient Active Problem List   Diagnosis Date Noted  . 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 BBB (bundle branch block) 07/27/2015  . Gastroenteritis 11/21/2014  . Paresthesia 03/25/2014  . Neck pain 02/14/2014    Past Medical History:  Diagnosis Date  . Broken toe   . Clostridium difficile diarrhea   . LBBB (left bundle branch block)    02/2014  . Osteopenia    may be starting the prolia injection/in between osteopenia and osteoporosis    Past Surgical History:  Procedure Laterality Date  . ABDOMINAL HYSTERECTOMY     TAH/BSO  . APPENDECTOMY    . BREAST BIOPSY Right    benign  . TUBAL LIGATION      MEDS:   Current Outpatient Medications on File Prior to Visit  Medication Sig Dispense Refill  . acidophilus (RISAQUAD) CAPS capsule Take 1 capsule by mouth.     . ALPRAZolam (XANAX) 0.25 MG tablet TAKE 1/2 TO 1  TABLET EVERY DAY AS NEEDED NO MORE THAN ONCE DAILY 20 tablet 0  . estradiol (ESTRACE) 0.1 MG/GM vaginal cream Apply small to urethra twice daily for 7 days. 42.5 g 0  . irbesartan (AVAPRO) 75 MG tablet Take 1 tablet by mouth daily.  0  . Multiple Vitamins-Minerals (MULTIVITAMIN WITH MINERALS) tablet Take 1 tablet by mouth daily.    Marland Kitchen Peppermint Oil (IBGARD PO) Take by mouth.     No current facility-administered medications on file prior to visit.    ALLERGIES: Ciprofloxacin, Darvon, and Mtx support [cobalamin combinations]  Family History  Problem Relation Age of Onset  . Thyroid disease Mother   . Breast cancer Maternal Aunt 60  . Heart disease Maternal Grandmother   . Heart attack Paternal Grandmother   . Heart attack Brother   . Hodgkin's lymphoma Son 47  . Colon cancer Cousin     SH:  Widowed, non smoker  Review of Systems  Constitutional: Negative.   Gastrointestinal: Positive for abdominal distention, constipation and rectal pain.  Genitourinary: Negative.     PHYSICAL EXAMINATION:    BP 118/60   Pulse 70   Resp 16   Wt 133 lb (60.3 kg)   LMP 09/16/1993   BMI 24.72 kg/m     General appearance: alert, cooperative and appears stated  age Abdomen: soft, non-tender; bowel sounds normal; no masses,  no organomegaly Lymph:  no inguinal LAD noted  Pelvic: External genitalia:  no lesions             Urethra:  significantly atropic tissue around the urethra with no masses, tenderness or lesions              Bartholins and Skenes: normal                 Vagina: normal appearing vagina with normal color and discharge, no lesions              Cervix: absent              Bimanual Exam:  Uterus:  uterus absent              Adnexa: no mass, fullness, tenderness              Anus:  normal sphincter tone, no lesions  Chaperone, Terence Lux, CMA, was present for exam.  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  New rx sent to pharmacy.  Specific instructions provided.  She is advised to call with any new bleeding She has follow up with Dr. Collene Mares already scheduled for GI issues.

## 2020-06-27 ENCOUNTER — Ambulatory Visit: Payer: Self-pay | Admitting: Obstetrics and Gynecology

## 2020-06-27 DIAGNOSIS — H40033 Anatomical narrow angle, bilateral: Secondary | ICD-10-CM | POA: Diagnosis not present

## 2020-07-03 ENCOUNTER — Other Ambulatory Visit: Payer: Self-pay | Admitting: Obstetrics & Gynecology

## 2020-07-03 DIAGNOSIS — F4310 Post-traumatic stress disorder, unspecified: Secondary | ICD-10-CM

## 2020-07-03 NOTE — Telephone Encounter (Signed)
Medication refill request: Alprazolam 0.25mg   Last OV:  06/22/20 Next AEX: not scheduled Last MMG (if hormonal medication request): NA Refill authorized: 20/0

## 2020-07-04 DIAGNOSIS — K602 Anal fissure, unspecified: Secondary | ICD-10-CM | POA: Diagnosis not present

## 2020-07-04 DIAGNOSIS — K449 Diaphragmatic hernia without obstruction or gangrene: Secondary | ICD-10-CM | POA: Diagnosis not present

## 2020-07-04 DIAGNOSIS — K219 Gastro-esophageal reflux disease without esophagitis: Secondary | ICD-10-CM | POA: Diagnosis not present

## 2020-07-04 DIAGNOSIS — K760 Fatty (change of) liver, not elsewhere classified: Secondary | ICD-10-CM | POA: Diagnosis not present

## 2020-07-05 NOTE — Telephone Encounter (Signed)
Left message for patient to call office.    York Cerise, CMA

## 2020-07-05 NOTE — Telephone Encounter (Signed)
Could you please call pt.  This has been refilled in July, August, September and now the request in October.  If she is using this basically every day, then we need to discuss other options.  Thanks.  Rx declined for now.

## 2020-07-07 NOTE — Telephone Encounter (Signed)
Pt returned call. Spoke with pt. Pt given update and recommendations per Dr Sabra Heck on Rx Xanax. Pt states only taking 1/2 tab of Rx every night at bedtime. Pt states has some pills left and that she did request Rx.  Pt advised to make OV to discuss other options per Dr Sabra Heck. Pt agreeable. Pt scheduled for med change/update on 10/28 at 430 pm. Pt agreeable to date and time of appt.  Routing to Dr Sabra Heck for update  Encounter closed

## 2020-07-07 NOTE — Telephone Encounter (Signed)
Left message for pt to call office.   York Cerise, CMA

## 2020-07-10 NOTE — Progress Notes (Deleted)
GYNECOLOGY  VISIT  CC:   ***  HPI: 79 y.o. G3P3 Widowed White or Caucasian female here for follow up.  GYNECOLOGIC HISTORY: Patient's last menstrual period was 09/16/1993. Contraception: *** Menopausal hormone therapy: ***  Patient Active Problem List   Diagnosis Date Noted  . 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 BBB (bundle branch block) 07/27/2015  . Gastroenteritis 11/21/2014  . Paresthesia 03/25/2014  . Neck pain 02/14/2014    Past Medical History:  Diagnosis Date  . Broken toe   . Clostridium difficile diarrhea   . LBBB (left bundle branch block)    02/2014  . Osteopenia    may be starting the prolia injection/in between osteopenia and osteoporosis    Past Surgical History:  Procedure Laterality Date  . ABDOMINAL HYSTERECTOMY     TAH/BSO  . APPENDECTOMY    . BREAST BIOPSY Right    benign  . TUBAL LIGATION      MEDS:   Current Outpatient Medications on File Prior to Visit  Medication Sig Dispense Refill  . acidophilus (RISAQUAD) CAPS capsule Take 1 capsule by mouth.     . ALPRAZolam (XANAX) 0.25 MG tablet TAKE 1/2 TO 1 TABLET EVERY DAY AS NEEDED NO MORE THAN ONCE DAILY 20 tablet 0  . estradiol (ESTRACE) 0.1 MG/GM vaginal cream Apply small to urethra twice weekly 42.5 g 1  . irbesartan (AVAPRO) 75 MG tablet Take 1 tablet by mouth daily.  0  . Multiple Vitamins-Minerals (MULTIVITAMIN WITH MINERALS) tablet Take 1 tablet by mouth daily.    Marland Kitchen Peppermint Oil (IBGARD PO) Take by mouth.     No current facility-administered medications on file prior to visit.    ALLERGIES: Ciprofloxacin, Darvon, and Mtx support [cobalamin combinations]  Family History  Problem Relation Age of Onset  . Thyroid disease Mother   . Breast cancer Maternal Aunt 60  . Heart disease Maternal Grandmother   . Heart attack Paternal Grandmother   . Heart attack Brother   . Hodgkin's lymphoma Son 31  . Colon cancer Cousin     SH:  ***  Review of Systems  PHYSICAL  EXAMINATION:    LMP 09/16/1993     General appearance: alert, cooperative and appears stated age Neck: no adenopathy, supple, symmetrical, trachea midline and thyroid {CHL AMB PHY EX THYROID NORM DEFAULT:[id number]::"normal to inspection and palpation"} CV:  {Exam; heart brief:31539} Lungs:  {pe lungs ob:314451::"clear to auscultation, no wheezes, rales or rhonchi, symmetric air entry"} Breasts: {Exam; breast:13139::"normal appearance, no masses or tenderness"} Abdomen: soft, non-tender; bowel sounds normal; no masses,  no organomegaly Lymph:  no inguinal LAD noted  Pelvic: External genitalia:  no lesions              Urethra:  normal appearing urethra with no masses, tenderness or lesions              Bartholins and Skenes: normal                 Vagina: normal appearing vagina with normal color and discharge, no lesions              Cervix: {CHL AMB PHY EX CERVIX NORM DEFAULT:[phone]::"no lesions"}              Bimanual Exam:  Uterus:  {CHL AMB PHY EX UTERUS NORM DEFAULT:[phone]::"normal size, contour, position, consistency, mobility, non-tender"}              Adnexa: {CHL AMB PHY EX ADNEXA NO MASS DEFAULT:[phone]::"no mass, fullness, tenderness"}  Rectovaginal: {yes no:314532}.  Confirms.              Anus:  normal sphincter tone, no lesions  Chaperone, ***Terence Lux, CMA, was present for exam.  Assessment: ***  Plan: ***   {NUMBERS; -10-45 JOINT ROM:10287} minutes of total time was spent for this patient encounter, including preparation, face-to-face counseling with the patient and coordination of care, and documentation of the encounter.

## 2020-07-12 ENCOUNTER — Telehealth: Payer: Self-pay

## 2020-07-12 NOTE — Telephone Encounter (Signed)
Patient cancelled med check appointment and would like nurse to call her.

## 2020-07-12 NOTE — Telephone Encounter (Signed)
Spoke with Sylvia Kennedy. Sylvia Kennedy having questions regarding Xanax Rx and wanting to know if needs med update OV. Sylvia Kennedy advised to keep OV to discuss since Sylvia Kennedy having multiple questions about Rx and wanting to wean and has other questions for a possible medication change.  Sylvia Kennedy agreeable to keep OV on 10/28 at 430 pm with Dr Sabra Heck. Sylvia Kennedy verbalized understanding of date and time of appt.  Encounter closed

## 2020-07-13 ENCOUNTER — Encounter: Payer: Self-pay | Admitting: Obstetrics & Gynecology

## 2020-07-13 ENCOUNTER — Ambulatory Visit: Payer: PPO | Admitting: Obstetrics & Gynecology

## 2020-07-13 ENCOUNTER — Other Ambulatory Visit: Payer: Self-pay

## 2020-07-13 ENCOUNTER — Ambulatory Visit: Payer: Self-pay | Admitting: Obstetrics & Gynecology

## 2020-07-13 VITALS — BP 124/80 | HR 71 | Resp 10 | Ht 62.0 in | Wt 137.0 lb

## 2020-07-13 DIAGNOSIS — F419 Anxiety disorder, unspecified: Secondary | ICD-10-CM

## 2020-07-13 DIAGNOSIS — M8580 Other specified disorders of bone density and structure, unspecified site: Secondary | ICD-10-CM | POA: Diagnosis not present

## 2020-07-13 DIAGNOSIS — G4700 Insomnia, unspecified: Secondary | ICD-10-CM | POA: Diagnosis not present

## 2020-07-13 DIAGNOSIS — F4310 Post-traumatic stress disorder, unspecified: Secondary | ICD-10-CM | POA: Diagnosis not present

## 2020-07-13 MED ORDER — ALPRAZOLAM 0.25 MG PO TABS: ORAL_TABLET | ORAL | 1 refills | Status: DC

## 2020-07-13 NOTE — Progress Notes (Signed)
GYNECOLOGY  VISIT  CC:   Pt here to discuss use of Xanax and possible alternatives   HPI: 79 y.o. G3P3 Widowed White or Caucasian female here for discussion of medications.  Pt uses 1/2 to 1/4 tab of xanax at night for sleep.  Does not have any side effects with this medication.  However, refill request came much earlier for this so I was concerned about use.  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  Has been seen and had x rays.  Foot is better but if up on feet for extended period of time, starts to have increased pain and swelling in left foot.  Aware she needs to follow up and possibly have additional imaging as this isn't improving very quickly.  She is being careful about the shoes she wears.  Reports she took more xanax due to anxiety with foot injury.  D/w pt not to do this going forward as medication she is taking is addictive.  Also, encourage pt if she only needs 1/4 tablet, to try and use this instead of the 1/2 tab.  Other options discussed including trazodone for non addiction potential.  Pt very concerned about side effects.  Voices understanding about Xanax concerns.  We have also discussed SSRI use for treatment of anxiety and pt really contemplated this but ultimately decided not to take medication due to concerns about side effects.  Lastly, she has questions about BMD.  Last one done wtihin last 5 years and was with Dr. Danna Hefty office.  States she was told she would be called about this being scheduled at his office but was never called.  [facility].  GYNECOLOGIC HISTORY: Patient's last menstrual period was 09/16/1993. Contraception: post menopausal   Menopausal hormone therapy: Estradiol   Patient Active Problem List   Diagnosis Date Noted  . 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 BBB (bundle branch block) 07/27/2015  . Gastroenteritis 11/21/2014  . Paresthesia 03/25/2014  . Neck pain 02/14/2014    Past  Medical History:  Diagnosis Date  . Broken toe   . Clostridium difficile diarrhea   . LBBB (left bundle branch block)    02/2014  . Osteopenia    may be starting the prolia injection/in between osteopenia and osteoporosis    Past Surgical History:  Procedure Laterality Date  . ABDOMINAL HYSTERECTOMY     TAH/BSO  . APPENDECTOMY    . BREAST BIOPSY Right    benign  . TUBAL LIGATION      MEDS:   Current Outpatient Medications on File Prior to Visit  Medication Sig Dispense Refill  . acidophilus (RISAQUAD) CAPS capsule Take 1 capsule by mouth.     . estradiol (ESTRACE) 0.1 MG/GM vaginal cream Apply small to urethra twice weekly 42.5 g 1  . FLUZONE HIGH-DOSE QUADRIVALENT 0.7 ML SUSY     . irbesartan (AVAPRO) 75 MG tablet Take 1 tablet by mouth daily.  0  . Multiple Vitamins-Minerals (MULTIVITAMIN WITH MINERALS) tablet Take 1 tablet by mouth daily.    Marland Kitchen omeprazole (PRILOSEC) 20 MG capsule Take 20 mg by mouth daily.    Marland Kitchen Peppermint Oil (IBGARD PO) Take by mouth.     No current facility-administered medications on file prior to visit.    ALLERGIES: Ciprofloxacin, Darvon, and Mtx support [cobalamin combinations]  Family History  Problem Relation Age of Onset  . Thyroid disease Mother   . Breast cancer Maternal Aunt 60  .  Heart disease Maternal Grandmother   . Heart attack Paternal Grandmother   . Heart attack Brother   . Hodgkin's lymphoma Son 27  . Colon cancer Cousin     SH:  Widowed, non smoker  Review of Systems  Psychiatric/Behavioral: Positive for sleep disturbance.       Anxiety    PHYSICAL EXAMINATION:    BP 124/80   Pulse 71   Resp 10   Ht 5\' 2"  (1.575 m)   Wt 137 lb (62.1 kg)   LMP 09/16/1993   SpO2 99%   BMI 25.06 kg/m     General appearance: alert, cooperative and appears stated age No other physical exam performed  Assessment: Anxiety Sleep disturbance  PMP, no HRT  Plan: Will continue with xanax 0.5mg  1/4 to 1/2 tab nightly prn insomnia.   #30/1RF.  Pt aware not to use for other problems but, if she does, should let me know.    BMD order placed.  Information for pt to schedule.     24 minutes of total time was spent for this patient encounter, including preparation, face-to-face counseling with the patient and coordination of care, and documentation of the encounter.

## 2020-08-01 DIAGNOSIS — M79672 Pain in left foot: Secondary | ICD-10-CM | POA: Diagnosis not present

## 2020-08-22 ENCOUNTER — Ambulatory Visit: Payer: PPO | Admitting: Obstetrics & Gynecology

## 2020-09-26 DIAGNOSIS — M1990 Unspecified osteoarthritis, unspecified site: Secondary | ICD-10-CM | POA: Diagnosis not present

## 2020-09-26 DIAGNOSIS — F419 Anxiety disorder, unspecified: Secondary | ICD-10-CM | POA: Diagnosis not present

## 2020-09-26 DIAGNOSIS — R1030 Lower abdominal pain, unspecified: Secondary | ICD-10-CM | POA: Diagnosis not present

## 2020-09-26 DIAGNOSIS — K589 Irritable bowel syndrome without diarrhea: Secondary | ICD-10-CM | POA: Diagnosis not present

## 2020-09-30 ENCOUNTER — Other Ambulatory Visit: Payer: Self-pay | Admitting: Obstetrics & Gynecology

## 2020-09-30 DIAGNOSIS — M8580 Other specified disorders of bone density and structure, unspecified site: Secondary | ICD-10-CM

## 2020-09-30 DIAGNOSIS — Z1231 Encounter for screening mammogram for malignant neoplasm of breast: Secondary | ICD-10-CM

## 2021-01-24 ENCOUNTER — Ambulatory Visit
Admission: RE | Admit: 2021-01-24 | Discharge: 2021-01-24 | Disposition: A | Discharge status: Home / Self Care | Payer: PPO | Source: Ambulatory Visit | Attending: Obstetrics & Gynecology | Admitting: Obstetrics & Gynecology

## 2021-01-24 ENCOUNTER — Other Ambulatory Visit: Payer: Self-pay

## 2021-01-24 DIAGNOSIS — Z1231 Encounter for screening mammogram for malignant neoplasm of breast: Secondary | ICD-10-CM

## 2021-01-24 DIAGNOSIS — M8589 Other specified disorders of bone density and structure, multiple sites: Secondary | ICD-10-CM | POA: Diagnosis not present

## 2021-01-24 DIAGNOSIS — M8580 Other specified disorders of bone density and structure, unspecified site: Secondary | ICD-10-CM

## 2021-01-24 DIAGNOSIS — Z780 Asymptomatic menopausal state: Secondary | ICD-10-CM | POA: Diagnosis not present

## 2021-02-19 DIAGNOSIS — M810 Age-related osteoporosis without current pathological fracture: Secondary | ICD-10-CM | POA: Diagnosis not present

## 2021-02-19 DIAGNOSIS — E785 Hyperlipidemia, unspecified: Secondary | ICD-10-CM | POA: Diagnosis not present

## 2021-02-19 DIAGNOSIS — Z0000 Encounter for general adult medical examination without abnormal findings: Secondary | ICD-10-CM | POA: Diagnosis not present

## 2021-02-19 DIAGNOSIS — E040 Nontoxic diffuse goiter: Secondary | ICD-10-CM | POA: Diagnosis not present

## 2021-02-26 DIAGNOSIS — M810 Age-related osteoporosis without current pathological fracture: Secondary | ICD-10-CM | POA: Diagnosis not present

## 2021-02-26 DIAGNOSIS — E040 Nontoxic diffuse goiter: Secondary | ICD-10-CM | POA: Diagnosis not present

## 2021-02-26 DIAGNOSIS — F419 Anxiety disorder, unspecified: Secondary | ICD-10-CM | POA: Diagnosis not present

## 2021-02-26 DIAGNOSIS — I872 Venous insufficiency (chronic) (peripheral): Secondary | ICD-10-CM | POA: Diagnosis not present

## 2021-02-26 DIAGNOSIS — Z23 Encounter for immunization: Secondary | ICD-10-CM | POA: Diagnosis not present

## 2021-02-26 DIAGNOSIS — E785 Hyperlipidemia, unspecified: Secondary | ICD-10-CM | POA: Diagnosis not present

## 2021-02-26 DIAGNOSIS — I447 Left bundle-branch block, unspecified: Secondary | ICD-10-CM | POA: Diagnosis not present

## 2021-02-26 DIAGNOSIS — M1990 Unspecified osteoarthritis, unspecified site: Secondary | ICD-10-CM | POA: Diagnosis not present

## 2021-02-26 DIAGNOSIS — Z0000 Encounter for general adult medical examination without abnormal findings: Secondary | ICD-10-CM | POA: Diagnosis not present

## 2021-02-26 DIAGNOSIS — M79602 Pain in left arm: Secondary | ICD-10-CM | POA: Diagnosis not present

## 2021-02-26 DIAGNOSIS — I10 Essential (primary) hypertension: Secondary | ICD-10-CM | POA: Diagnosis not present

## 2021-05-03 DIAGNOSIS — B029 Zoster without complications: Secondary | ICD-10-CM | POA: Diagnosis not present

## 2021-06-04 ENCOUNTER — Encounter (HOSPITAL_BASED_OUTPATIENT_CLINIC_OR_DEPARTMENT_OTHER): Payer: Self-pay | Admitting: Obstetrics & Gynecology

## 2021-06-04 ENCOUNTER — Other Ambulatory Visit: Payer: Self-pay

## 2021-06-04 ENCOUNTER — Other Ambulatory Visit (HOSPITAL_COMMUNITY)
Admission: RE | Admit: 2021-06-04 | Discharge: 2021-06-04 | Disposition: A | Discharge status: Home / Self Care | Payer: PPO | Source: Ambulatory Visit | Attending: Obstetrics & Gynecology | Admitting: Obstetrics & Gynecology

## 2021-06-04 ENCOUNTER — Ambulatory Visit (INDEPENDENT_AMBULATORY_CARE_PROVIDER_SITE_OTHER): Payer: PPO | Admitting: Obstetrics & Gynecology

## 2021-06-04 VITALS — BP 144/57 | HR 66 | Ht 62.0 in | Wt 145.8 lb

## 2021-06-04 DIAGNOSIS — G4709 Other insomnia: Secondary | ICD-10-CM

## 2021-06-04 DIAGNOSIS — M8580 Other specified disorders of bone density and structure, unspecified site: Secondary | ICD-10-CM

## 2021-06-04 DIAGNOSIS — Z124 Encounter for screening for malignant neoplasm of cervix: Secondary | ICD-10-CM | POA: Insufficient documentation

## 2021-06-04 DIAGNOSIS — Z1211 Encounter for screening for malignant neoplasm of colon: Secondary | ICD-10-CM | POA: Diagnosis not present

## 2021-06-04 DIAGNOSIS — Z01419 Encounter for gynecological examination (general) (routine) without abnormal findings: Secondary | ICD-10-CM

## 2021-06-04 MED ORDER — VITAMIN D (CHOLECALCIFEROL) 25 MCG (1000 UT) PO CAPS: 1.0000 | ORAL_CAPSULE | Freq: Every day | ORAL | Status: AC

## 2021-06-04 NOTE — Progress Notes (Signed)
80 y.o. G3P3 Widowed White or Caucasian female here for breast and pelvic exam.  I am also following her for insomnia and osteopenia.  Last BMD was 5/22.  T score was -2.4.  She is taking Viactiv chews about three a week.  She is taking a vitamin as well with 100mg  calcium.  She is also taking 1000 IU Vit D daily as well.  She is exercising using silver sneakers [facility] [place].  Denies vaginal bleeding.  Has not taken a alprazolam since last year.  Is off this.  Having some anxiety.  Does not want to try anything else for the alprazolam.  We discussed some other options including trazodone and SSRi.  Has rx for SSRI that she's never taken.    Patient's last menstrual period was 09/16/1993.          Sexually active: No.  H/O STD:  no  Health Maintenance: PCP:  Dr. Dagmar Hait.  Last wellness appt was May, 2022.  Did blood work at that appt:  yes.  Cholesterol was elevated a little bit. Vaccines are up to date:  has not done 4th covid vaccine, declines shingles vaccine Colonoscopy:  03/31/2018 Cologard MMG:  01/24/2021 Negative  BMD:  01/24/2021 Osteopenia Last pap smear:  11/18/2016 Negative.   H/o abnormal pap smear:  no   reports that she has never smoked. She has never used smokeless tobacco. She reports that she does not drink alcohol and does not use drugs.  Past Medical History:  Diagnosis Date   Broken toe    Clostridium difficile diarrhea    LBBB (left bundle branch block)    02/2014   Osteopenia    may be starting the prolia injection/in between osteopenia and osteoporosis    Past Surgical History:  Procedure Laterality Date   ABDOMINAL HYSTERECTOMY     TAH/BSO   APPENDECTOMY     BREAST BIOPSY Right    benign   TUBAL LIGATION      Current Outpatient Medications  Medication Sig Dispense Refill   acidophilus (RISAQUAD) CAPS capsule Take 1 capsule by mouth.      estradiol (ESTRACE) 0.1 MG/GM vaginal cream Apply small to urethra twice weekly 42.5 g 1   FLUZONE  HIGH-DOSE QUADRIVALENT 0.7 ML SUSY      irbesartan (AVAPRO) 75 MG tablet Take 1 tablet by mouth daily.  0   Multiple Vitamins-Minerals (MULTIVITAMIN WITH MINERALS) tablet Take 1 tablet by mouth daily.     omeprazole (PRILOSEC) 20 MG capsule Take 20 mg by mouth daily.     Vitamin D, Cholecalciferol, 25 MCG (1000 UT) CAPS Take 1 capsule by mouth daily. 60 capsule    ALPRAZolam (XANAX) 0.25 MG tablet 1/4 to 1/2 nightly as needed for sleep or anxiety (Patient not taking: Reported on 06/04/2021) 30 tablet 1   Peppermint Oil (IBGARD PO) Take by mouth. (Patient not taking: Reported on 06/04/2021)     No current facility-administered medications for this visit.    Family History  Problem Relation Age of Onset   Thyroid disease Mother    Breast cancer Maternal Aunt 85   Heart disease Maternal Grandmother    Heart attack Paternal Grandmother    Heart attack Brother    Hodgkin's lymphoma Son 67   Colon cancer Cousin     Review of Systems  Constitutional: Negative.   Gastrointestinal: Negative.   Genitourinary: Negative.    Exam:   BP (!) 144/57 (BP Location: Right Arm, Patient Position:  Sitting, Cuff Size: Large)   Pulse 66   Ht 5\' 2"  (1.575 m)   Wt 145 lb 12.8 oz (66.1 kg)   LMP 09/16/1993   BMI 26.67 kg/m   Height: 5\' 2"  (157.5 cm)  General appearance: alert, cooperative and appears stated age Breasts: normal appearance, no masses or tenderness Abdomen: soft, non-tender; bowel sounds normal; no masses,  no organomegaly Lymph nodes: Cervical, supraclavicular, and axillary nodes normal.  No abnormal inguinal nodes palpated Neurologic: Grossly normal  Pelvic: External genitalia:  no lesions              Urethra:  normal appearing urethra with no masses, tenderness or lesions              Bartholins and Skenes: normal                 Vagina: normal appearing vagina with atrophic changes and no discharge, no lesions              Cervix: no lesions              Pap taken: Yes.  , per pt  request Bimanual Exam:  Uterus:  normal size, contour, position, consistency, mobility, non-tender              Adnexa: normal adnexa and no mass, fullness, tenderness               Rectovaginal: Confirms               Anus:  normal sphincter tone, no lesions  Chaperone, Octaviano Batty, CMA, was present for exam.  Assessment/Plan: 1. Encntr for gyn exam (general) (routine) w/o abn findings - pap desires pap smear today - BMD will repeated in two years - MMG up to date - Cologuard ordered - vaccines updated/reviewed - blood work done with Dr. Dagmar Hait earlier this year  2.  Osteopenia, unspecified location  3.  Other insomnia - discussed trazodone and SSRI use.  Pt is going to consider. - She really wants to restart Xanax but as has stopped, highly recommended she not restart

## 2021-06-05 LAB — CYTOLOGY - PAP: Diagnosis: NEGATIVE

## 2021-06-23 DIAGNOSIS — Z1211 Encounter for screening for malignant neoplasm of colon: Secondary | ICD-10-CM | POA: Diagnosis not present

## 2021-07-02 LAB — COLOGUARD: Cologuard: NEGATIVE

## 2021-09-03 DIAGNOSIS — R5383 Other fatigue: Secondary | ICD-10-CM | POA: Diagnosis not present

## 2021-09-03 DIAGNOSIS — R0981 Nasal congestion: Secondary | ICD-10-CM | POA: Diagnosis not present

## 2021-09-03 DIAGNOSIS — Z1152 Encounter for screening for COVID-19: Secondary | ICD-10-CM | POA: Diagnosis not present

## 2021-09-03 DIAGNOSIS — U071 COVID-19: Secondary | ICD-10-CM | POA: Diagnosis not present

## 2021-09-03 DIAGNOSIS — I447 Left bundle-branch block, unspecified: Secondary | ICD-10-CM | POA: Diagnosis not present

## 2021-09-03 DIAGNOSIS — I10 Essential (primary) hypertension: Secondary | ICD-10-CM | POA: Diagnosis not present

## 2021-09-03 DIAGNOSIS — R051 Acute cough: Secondary | ICD-10-CM | POA: Diagnosis not present

## 2021-09-03 DIAGNOSIS — J309 Allergic rhinitis, unspecified: Secondary | ICD-10-CM | POA: Diagnosis not present

## 2021-10-23 DIAGNOSIS — H524 Presbyopia: Secondary | ICD-10-CM | POA: Diagnosis not present

## 2021-10-23 DIAGNOSIS — H2513 Age-related nuclear cataract, bilateral: Secondary | ICD-10-CM | POA: Diagnosis not present

## 2021-10-23 DIAGNOSIS — H40033 Anatomical narrow angle, bilateral: Secondary | ICD-10-CM | POA: Diagnosis not present

## 2021-10-23 DIAGNOSIS — H25013 Cortical age-related cataract, bilateral: Secondary | ICD-10-CM | POA: Diagnosis not present

## 2021-10-24 DIAGNOSIS — Z85828 Personal history of other malignant neoplasm of skin: Secondary | ICD-10-CM | POA: Diagnosis not present

## 2021-10-24 DIAGNOSIS — L570 Actinic keratosis: Secondary | ICD-10-CM | POA: Diagnosis not present

## 2021-10-24 DIAGNOSIS — L820 Inflamed seborrheic keratosis: Secondary | ICD-10-CM | POA: Diagnosis not present

## 2021-10-24 DIAGNOSIS — B078 Other viral warts: Secondary | ICD-10-CM | POA: Diagnosis not present

## 2021-10-24 DIAGNOSIS — Z08 Encounter for follow-up examination after completed treatment for malignant neoplasm: Secondary | ICD-10-CM | POA: Diagnosis not present

## 2021-10-24 DIAGNOSIS — Z1283 Encounter for screening for malignant neoplasm of skin: Secondary | ICD-10-CM | POA: Diagnosis not present

## 2021-10-24 DIAGNOSIS — X32XXXD Exposure to sunlight, subsequent encounter: Secondary | ICD-10-CM | POA: Diagnosis not present

## 2021-12-20 DIAGNOSIS — M1711 Unilateral primary osteoarthritis, right knee: Secondary | ICD-10-CM | POA: Diagnosis not present

## 2021-12-20 DIAGNOSIS — M25561 Pain in right knee: Secondary | ICD-10-CM | POA: Diagnosis not present

## 2021-12-24 DIAGNOSIS — Z1152 Encounter for screening for COVID-19: Secondary | ICD-10-CM | POA: Diagnosis not present

## 2021-12-24 DIAGNOSIS — R0981 Nasal congestion: Secondary | ICD-10-CM | POA: Diagnosis not present

## 2021-12-24 DIAGNOSIS — I10 Essential (primary) hypertension: Secondary | ICD-10-CM | POA: Diagnosis not present

## 2021-12-24 DIAGNOSIS — R059 Cough, unspecified: Secondary | ICD-10-CM | POA: Diagnosis not present

## 2021-12-24 DIAGNOSIS — R5383 Other fatigue: Secondary | ICD-10-CM | POA: Diagnosis not present

## 2022-01-07 ENCOUNTER — Other Ambulatory Visit: Payer: Self-pay | Admitting: Obstetrics & Gynecology

## 2022-01-07 DIAGNOSIS — Z1231 Encounter for screening mammogram for malignant neoplasm of breast: Secondary | ICD-10-CM

## 2022-01-28 ENCOUNTER — Ambulatory Visit
Admission: RE | Admit: 2022-01-28 | Discharge: 2022-01-28 | Disposition: A | Discharge status: Home / Self Care | Payer: PPO | Source: Ambulatory Visit | Attending: Obstetrics & Gynecology | Admitting: Obstetrics & Gynecology

## 2022-01-28 DIAGNOSIS — Z1231 Encounter for screening mammogram for malignant neoplasm of breast: Secondary | ICD-10-CM | POA: Diagnosis not present

## 2022-02-12 IMAGING — CT CT ABD-PELV W/ CM
2 of 5 series · 12 of 46 positions shown, 14 images · IV contrast (iopamidol)
Comparison: 11/02/2011 from [HOSPITAL]

CLINICAL DATA: Right lower quadrant pain for approximately 5
months. Diarrhea. Prior appendectomy and hysterectomy.

Creatinine was obtained on site at [HOSPITAL] at [REDACTED].
Results: Creatinine 0.9 mg/dL.
EXAM:
CT ABDOMEN AND PELVIS WITH CONTRAST
TECHNIQUE: Multidetector CT imaging of the abdomen and pelvis was performed
using the standard protocol following bolus administration of
intravenous contrast.
CONTRAST:  100mL CCQSNN-AYY IOPAMIDOL (CCQSNN-AYY) INJECTION 61%

[Series 2: abd pelvis 5.00 br40 s3 axial · axial · 0.53mm/px · z∈[+1293,+1663]mm · 9 of 84 slices shown, 11 images]
[im 5/84  soft-tissue]
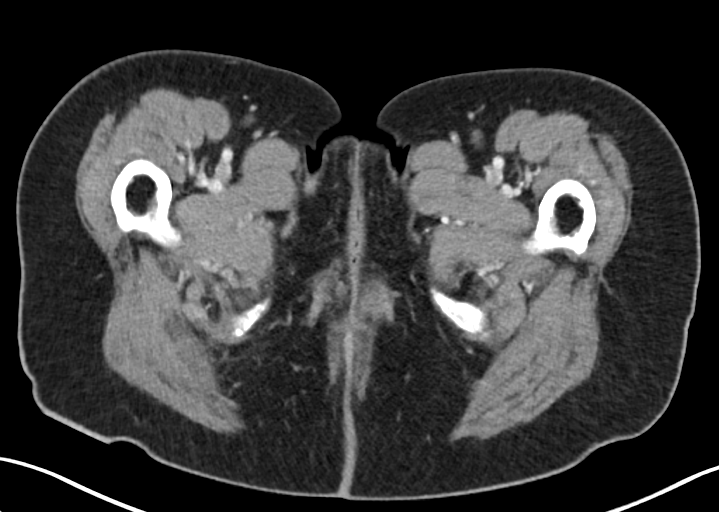
[im 5/84  bone]
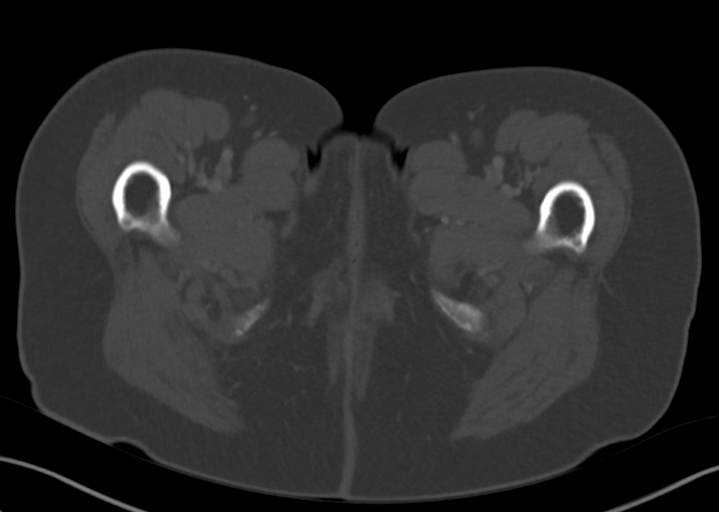
[im 14/84  soft-tissue]
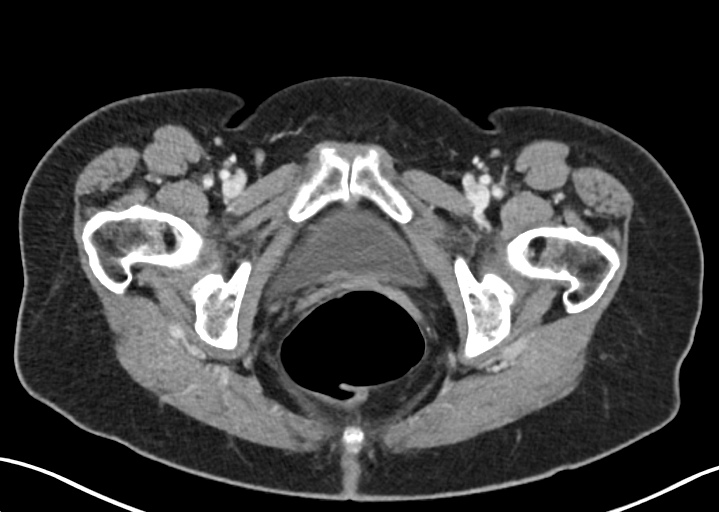
[im 24/84  soft-tissue]
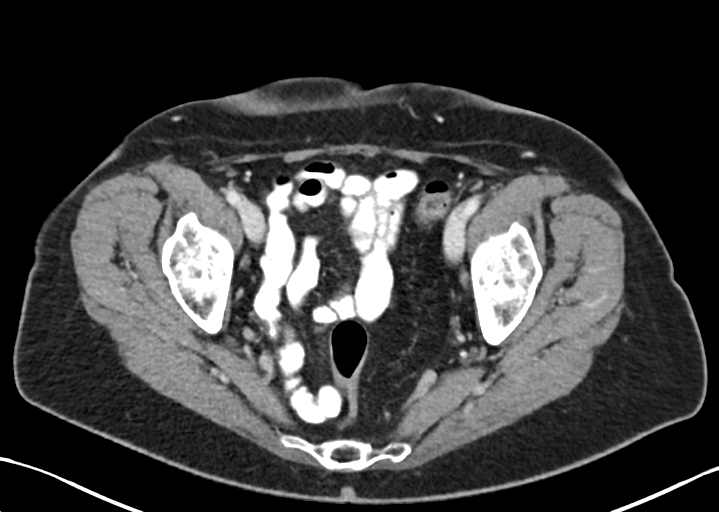
[im 33/84  soft-tissue]
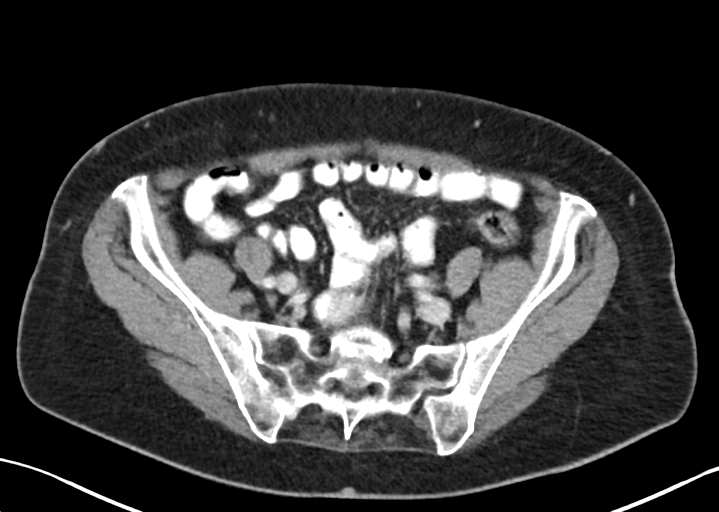
[im 42/84  soft-tissue]
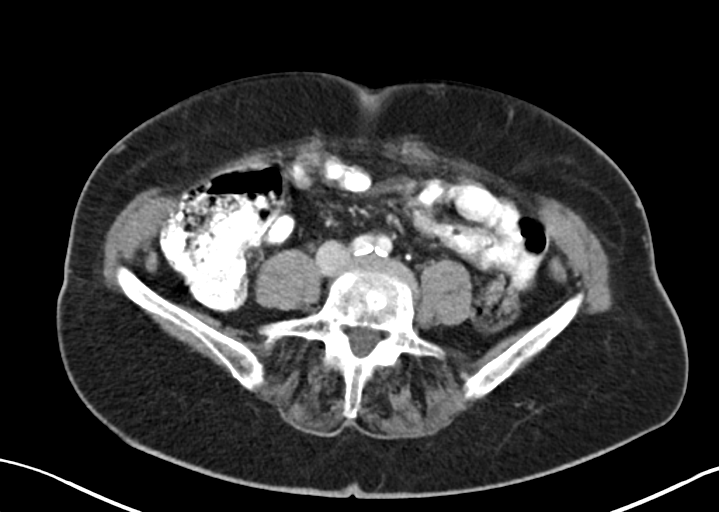
[im 51/84  soft-tissue]
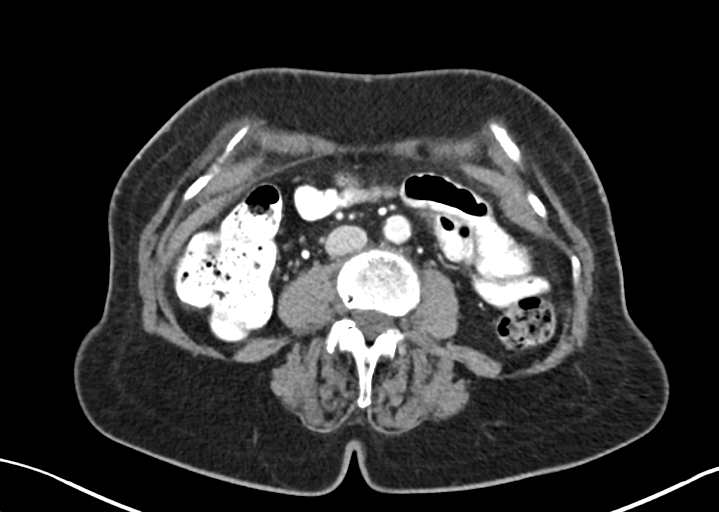
[im 60/84  soft-tissue]
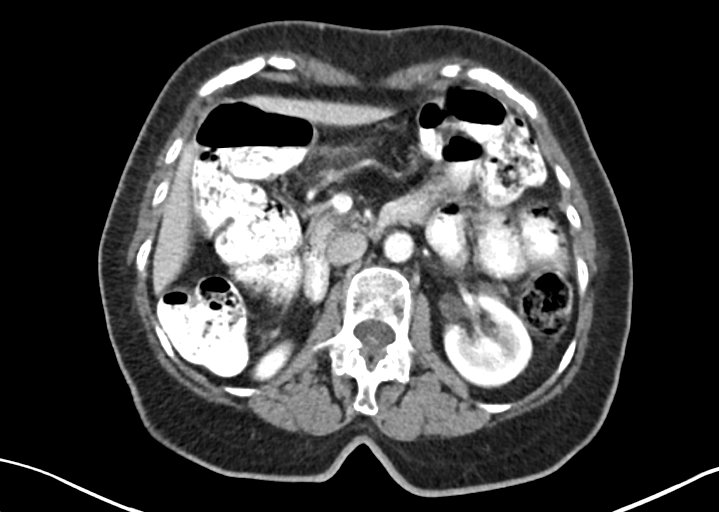
[im 70/84  soft-tissue]
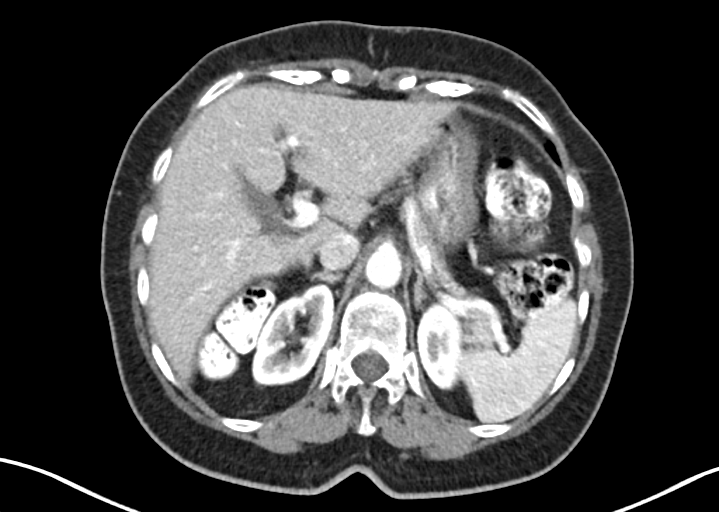
[im 79/84  soft-tissue]
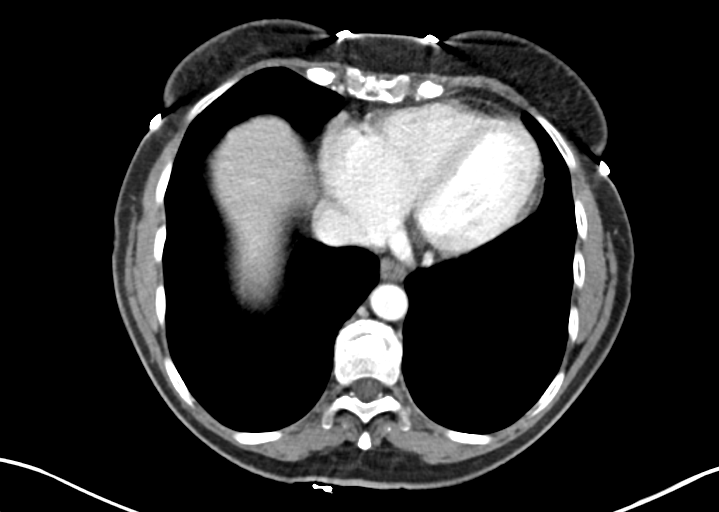
[im 79/84  bone]
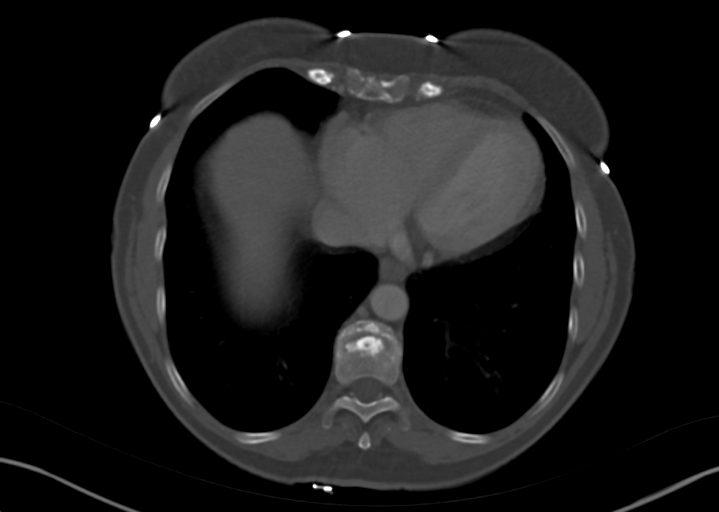

[Series 6: abd pelvis 2.00 br40 s3 cor · coronal · 0.73mm/px · 3 of 136 slices shown]
[im 46/136  soft-tissue]
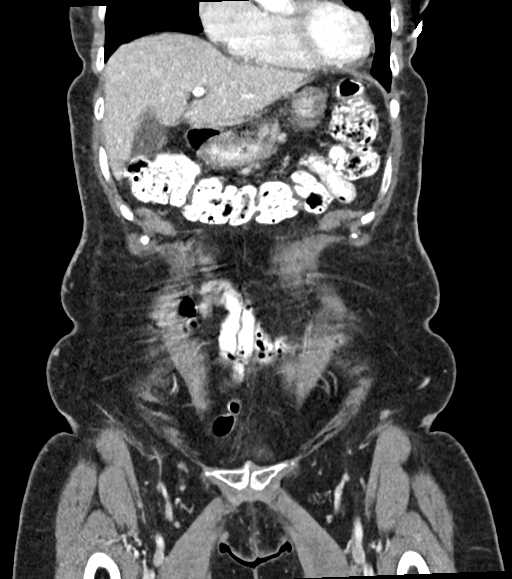
[im 61/136  soft-tissue]
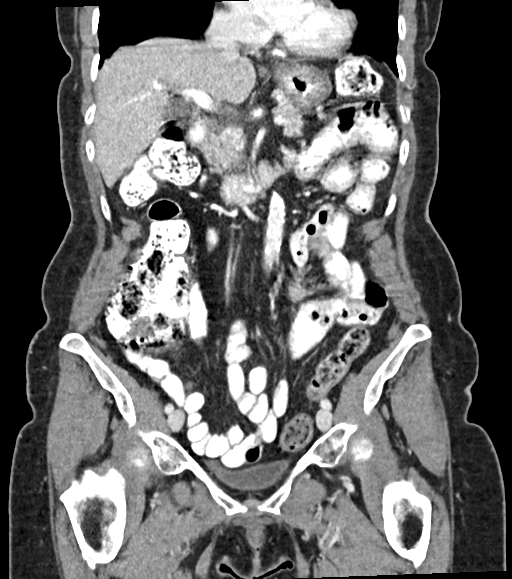
[im 76/136  soft-tissue]
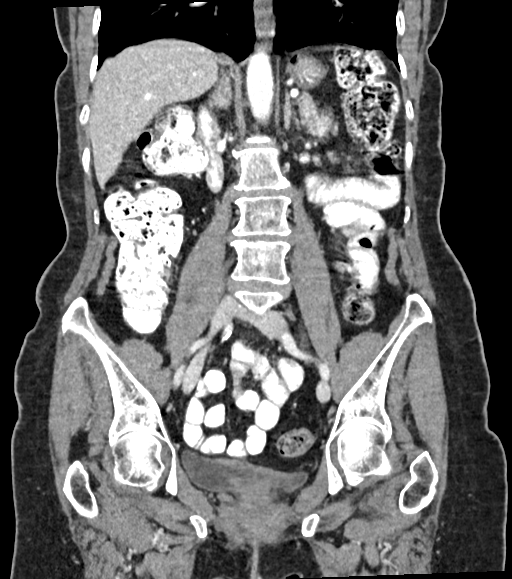

[12 of 46 positions shown; findings below may reference images not displayed]

FINDINGS: Lower Chest: No acute findings.

Hepatobiliary: No hepatic masses identified. Gallbladder is
unremarkable. No evidence of biliary ductal dilatation.

Pancreas:  No mass or inflammatory changes.

Spleen: Within normal limits in size and appearance.

Adrenals/Urinary Tract: No masses identified. No evidence of
ureteral calculi or hydronephrosis.

Stomach/Bowel: No evidence of obstruction, inflammatory process or
abnormal fluid collections.

Vascular/Lymphatic: No pathologically enlarged lymph nodes. No
abdominal aortic aneurysm. Aortic atherosclerosis noted.

Reproductive: Prior hysterectomy noted. Adnexal regions are
unremarkable in appearance.

Other:  None.

Musculoskeletal:  No suspicious bone lesions identified.
IMPRESSION: No acute findings within the abdomen or pelvis.

Aortic Atherosclerosis ([id number]-WDY.[id number]).

## 2022-03-20 DIAGNOSIS — M1711 Unilateral primary osteoarthritis, right knee: Secondary | ICD-10-CM | POA: Diagnosis not present

## 2022-03-20 DIAGNOSIS — M25561 Pain in right knee: Secondary | ICD-10-CM | POA: Diagnosis not present

## 2022-04-08 DIAGNOSIS — E785 Hyperlipidemia, unspecified: Secondary | ICD-10-CM | POA: Diagnosis not present

## 2022-04-08 DIAGNOSIS — F419 Anxiety disorder, unspecified: Secondary | ICD-10-CM | POA: Diagnosis not present

## 2022-04-08 DIAGNOSIS — E040 Nontoxic diffuse goiter: Secondary | ICD-10-CM | POA: Diagnosis not present

## 2022-04-08 DIAGNOSIS — R7989 Other specified abnormal findings of blood chemistry: Secondary | ICD-10-CM | POA: Diagnosis not present

## 2022-04-08 DIAGNOSIS — I10 Essential (primary) hypertension: Secondary | ICD-10-CM | POA: Diagnosis not present

## 2022-04-08 DIAGNOSIS — R5383 Other fatigue: Secondary | ICD-10-CM | POA: Diagnosis not present

## 2022-04-08 DIAGNOSIS — M810 Age-related osteoporosis without current pathological fracture: Secondary | ICD-10-CM | POA: Diagnosis not present

## 2022-04-15 DIAGNOSIS — Z0000 Encounter for general adult medical examination without abnormal findings: Secondary | ICD-10-CM | POA: Diagnosis not present

## 2022-04-15 DIAGNOSIS — I872 Venous insufficiency (chronic) (peripheral): Secondary | ICD-10-CM | POA: Diagnosis not present

## 2022-04-15 DIAGNOSIS — E040 Nontoxic diffuse goiter: Secondary | ICD-10-CM | POA: Diagnosis not present

## 2022-04-15 DIAGNOSIS — K219 Gastro-esophageal reflux disease without esophagitis: Secondary | ICD-10-CM | POA: Diagnosis not present

## 2022-04-15 DIAGNOSIS — E785 Hyperlipidemia, unspecified: Secondary | ICD-10-CM | POA: Diagnosis not present

## 2022-04-15 DIAGNOSIS — I447 Left bundle-branch block, unspecified: Secondary | ICD-10-CM | POA: Diagnosis not present

## 2022-04-15 DIAGNOSIS — R82998 Other abnormal findings in urine: Secondary | ICD-10-CM | POA: Diagnosis not present

## 2022-04-15 DIAGNOSIS — M810 Age-related osteoporosis without current pathological fracture: Secondary | ICD-10-CM | POA: Diagnosis not present

## 2022-04-15 DIAGNOSIS — F419 Anxiety disorder, unspecified: Secondary | ICD-10-CM | POA: Diagnosis not present

## 2022-04-15 DIAGNOSIS — B029 Zoster without complications: Secondary | ICD-10-CM | POA: Diagnosis not present

## 2022-04-15 DIAGNOSIS — M1711 Unilateral primary osteoarthritis, right knee: Secondary | ICD-10-CM | POA: Diagnosis not present

## 2022-04-15 DIAGNOSIS — I10 Essential (primary) hypertension: Secondary | ICD-10-CM | POA: Diagnosis not present

## 2022-04-15 DIAGNOSIS — M1990 Unspecified osteoarthritis, unspecified site: Secondary | ICD-10-CM | POA: Diagnosis not present

## 2022-04-29 DIAGNOSIS — M79661 Pain in right lower leg: Secondary | ICD-10-CM | POA: Diagnosis not present

## 2022-04-29 DIAGNOSIS — M79605 Pain in left leg: Secondary | ICD-10-CM | POA: Diagnosis not present

## 2022-04-29 DIAGNOSIS — M79604 Pain in right leg: Secondary | ICD-10-CM | POA: Diagnosis not present

## 2022-04-29 DIAGNOSIS — M79662 Pain in left lower leg: Secondary | ICD-10-CM | POA: Diagnosis not present

## 2022-05-19 ENCOUNTER — Emergency Department (HOSPITAL_COMMUNITY)
Admission: EM | Admit: 2022-05-19 | Discharge: 2022-05-19 | Disposition: A | Discharge status: Home / Self Care | Payer: PPO | Attending: Emergency Medicine | Admitting: Emergency Medicine

## 2022-05-19 ENCOUNTER — Other Ambulatory Visit: Payer: Self-pay

## 2022-05-19 ENCOUNTER — Encounter (HOSPITAL_COMMUNITY): Payer: Self-pay | Admitting: Emergency Medicine

## 2022-05-19 ENCOUNTER — Emergency Department (HOSPITAL_COMMUNITY): Payer: PPO

## 2022-05-19 DIAGNOSIS — R0789 Other chest pain: Secondary | ICD-10-CM | POA: Diagnosis not present

## 2022-05-19 DIAGNOSIS — R079 Chest pain, unspecified: Secondary | ICD-10-CM | POA: Diagnosis not present

## 2022-05-19 DIAGNOSIS — M545 Low back pain, unspecified: Secondary | ICD-10-CM | POA: Diagnosis not present

## 2022-05-19 LAB — CBC
HCT: 44.2 % (ref 36.0–46.0)
Hemoglobin: 14.8 g/dL (ref 12.0–15.0)
MCH: 30 pg (ref 26.0–34.0)
MCHC: 33.5 g/dL (ref 30.0–36.0)
MCV: 89.7 fL (ref 80.0–100.0)
Platelets: 245 10*3/uL (ref 150–400)
RBC: 4.93 MIL/uL (ref 3.87–5.11)
RDW: 12.1 % (ref 11.5–15.5)
WBC: 6.5 10*3/uL (ref 4.0–10.5)
nRBC: 0 % (ref 0.0–0.2)

## 2022-05-19 LAB — BASIC METABOLIC PANEL
Anion gap: 8 (ref 5–15)
BUN: 18 mg/dL (ref 8–23)
CO2: 25 mmol/L (ref 22–32)
Calcium: 9.5 mg/dL (ref 8.9–10.3)
Chloride: 106 mmol/L (ref 98–111)
Creatinine, Ser: 0.92 mg/dL (ref 0.44–1.00)
GFR, Estimated: >60 mL/min (ref >60)
Glucose, Bld: 102 mg/dL — ABNORMAL HIGH (ref 70–99)
Potassium: 3.6 mmol/L (ref 3.5–5.1)
Sodium: 139 mmol/L (ref 135–145)

## 2022-05-19 LAB — TROPONIN I (HIGH SENSITIVITY): Troponin I (High Sensitivity): 4 ng/L (ref <18)

## 2022-05-19 MED ORDER — METHOCARBAMOL 500 MG PO TABS: 500.0000 mg | ORAL_TABLET | Freq: Three times a day (TID) | ORAL | 0 refills | Status: DC

## 2022-05-19 MED ORDER — HYDROCODONE-ACETAMINOPHEN 5-325 MG PO TABS: 1.0000 | ORAL_TABLET | Freq: Four times a day (QID) | ORAL | 0 refills | Status: DC | PRN

## 2022-05-19 MED ORDER — PREDNISONE 20 MG PO TABS: 20.0000 mg | ORAL_TABLET | Freq: Every day | ORAL | 0 refills | Status: DC

## 2022-05-19 NOTE — ED Provider Notes (Signed)
[facility] EMERGENCY DEPARTMENT Provider Note   CSN: [id number] Arrival date & time: 05/19/22  0447     History  Chief Complaint  Patient presents with   Chest Pain    Sylvia Kennedy is a 81 y.o. female.  Pt complains of pain in her back under the left scapula since Friday.  Pt reports she has taken tums and tylenol without relief.  Pt denies any fever or chills.  Pt denies any injury    The history is provided by the patient. No language interpreter was used.  Back Pain Pain location: under left scapula. Radiates to:  L shoulder Pain severity:  Moderate Pain is:  Same all the time Onset quality:  Gradual Duration:  2 days Timing:  Constant Progression:  Worsening Chronicity:  New Context: not recent illness   Relieved by:  Nothing Worsened by:  Nothing Ineffective treatments:  None tried Associated symptoms: paresthesias   Risk factors: hx of osteoporosis        Home Medications Prior to Admission medications   Medication Sig Start Date End Date Taking? Authorizing Provider  acidophilus (RISAQUAD) CAPS capsule Take 1 capsule by mouth.     [provider]  ALPRAZolam Duanne Moron) 0.25 MG tablet 1/4 to 1/2 nightly as needed for sleep or anxiety Patient not taking: Reported on 06/04/2021 07/13/20   Megan Salon, MD  estradiol (ESTRACE) 0.1 MG/GM vaginal cream Apply small to urethra twice weekly 06/22/20   Megan Salon, MD  FLUZONE HIGH-DOSE QUADRIVALENT 0.7 ML SUSY  06/13/20   [provider]  irbesartan (AVAPRO) 75 MG tablet Take 1 tablet by mouth daily. 04/30/18   [provider]  Multiple Vitamins-Minerals (MULTIVITAMIN WITH MINERALS) tablet Take 1 tablet by mouth daily.    [provider]  omeprazole (PRILOSEC) 20 MG capsule Take 20 mg by mouth daily. 07/03/20   [provider]  Peppermint Oil (IBGARD PO) Take by mouth. Patient not taking: Reported on 06/04/2021    [provider]  Vitamin D,  Cholecalciferol, 25 MCG (1000 UT) CAPS Take 1 capsule by mouth daily. 06/04/21   Megan Salon, MD      Allergies    Ciprofloxacin, Darvon, Escitalopram, Mtx support [cobalamin combinations], and Penicillin g sodium    Review of Systems   Review of Systems  Musculoskeletal:  Positive for back pain.  Neurological:  Positive for paresthesias.  All other systems reviewed and are negative.   Physical Exam Updated Vital Signs BP 120/73   Pulse 73   Temp 98.5 F (36.9 C) (Oral)   Resp 18   LMP 09/16/1993   SpO2 100%  Physical Exam Vitals reviewed.  Constitutional:      Appearance: She is well-developed.  Cardiovascular:     Rate and Rhythm: Normal rate and regular rhythm.     Heart sounds: Normal heart sounds.  Pulmonary:     Effort: Pulmonary effort is normal.     Breath sounds: Normal breath sounds.  Abdominal:     Palpations: Abdomen is soft.  Musculoskeletal:        General: Normal range of motion.     Cervical back: Normal range of motion.  Skin:    General: Skin is warm.  Neurological:     General: No focal deficit present.     Mental Status: She is alert.     ED Results / Procedures / Treatments   Labs (all labs ordered are listed, but only abnormal results are  displayed) Labs Reviewed  BASIC METABOLIC PANEL - Abnormal; Notable for the following components:      Result Value   Glucose, Bld 102 (*)    All other components within normal limits  CBC  TROPONIN I (HIGH SENSITIVITY)  TROPONIN I (HIGH SENSITIVITY)    EKG EKG Interpretation  Date/Time:  'Sunday May 19 2022 05:11:45 EDT Ventricular Rate:  84 PR Interval:  156 QRS Duration: 126 QT Interval:  388 QTC Calculation: 458 R Axis:   84 Text Interpretation: Normal sinus rhythm Non-specific intra-ventricular conduction block T wave abnormality, consider inferior ischemia Abnormal ECG No significant change since last tracing Confirmed by Floyd, Dan ([id number]) on 05/19/2022 5:33:12 AM  Radiology DG  Chest 2 View  Result Date: 05/19/2022 CLINICAL DATA:  81 year old female with chest pain and back spasm. EXAM: CHEST - 2 VIEW COMPARISON:  Chest and abdominal radiographs 11/21/2014. FINDINGS: PA and lateral views of the chest at 0648 hours. Lung volumes and mediastinal contours are stable and within normal limits. Visualized tracheal air column is within normal limits. Both lungs appear clear. No pneumothorax or pleural effusion identified. Anterior thoracic disc and mild endplate degeneration. No acute osseous abnormality identified. Negative visible bowel gas. IMPRESSION: No acute cardiopulmonary abnormality. No acute osseous abnormality identified. Electronically Signed   By: H  Hall M.D.   On: 05/19/2022 06:32    Procedures Procedures    Medications Ordered in ED Medications - No data to display  ED Course/ Medical Decision Making/ A&P                           Medical Decision Making Pt complains of pain under her left scapula,  Pt reports she feels a knot there   Amount and/or Complexity of Data Reviewed Independent Historian:     Details: Famiy member here with pt External Data Reviewed: notes.    Details: Primacy care notes reviewed  Labs: ordered. Decision-making details documented in ED Course.    Details: Troponin is negative x 2 labs ordered reviewed and discussed with pt Radiology: ordered and independent interpretation performed. Decision-making details documented in ED Course.    Details: Chest xray  no acute  ECG/medicine tests: ordered and independent interpretation performed. Decision-making details documented in ED Course.    Details: EKG shows no acute abnormaltiy   Risk Prescription drug management. Risk Details: Pt counseled on treatment.  Pt reports she thinks she needs something for pain.  I will try low dose prednisone and hydrodocone .  Pt also request a muscle relaxer  request            Final Clinical Impression(s) / ED Diagnoses Final diagnoses:   Acute left-sided low back pain without sciatica    Rx / DC Orders ED Discharge Orders          Ordered    predniSONE (DELTASONE) 20 MG tablet  Daily        05/19/22 0735    HYDROcodone-acetaminophen (NORCO/VICODIN) 5-325 MG tablet  Every 6 hours PRN        05/19/22 0735    methocarbamol (ROBAXIN) 500 MG tablet  3 times daily        09'$ /03/23 [place], Linna Thebeau K, PA-C 05/19/22 [id number]    Dorie Rank, MD 05/20/22 [phone]

## 2022-05-19 NOTE — ED Notes (Signed)
Patient handoff given to [facility] RN.

## 2022-05-19 NOTE — ED Triage Notes (Signed)
Pt reported to ED with c/o pain to left shoulder blade that radiates down into chest and back that has been ongoing since Friday.

## 2022-05-19 NOTE — Discharge Instructions (Signed)
Watch for developing a rash.  See your Physician for recheck next week.  Watch sedation from pain medication and muscle relaxer

## 2022-05-20 ENCOUNTER — Telehealth: Payer: Self-pay | Admitting: Gastroenterology

## 2022-05-20 NOTE — Telephone Encounter (Signed)
Patient calling saying that she has been having back pain near her shoulder blades and pain down her side for 2 days.  Cannot sleep.  Went to the ED yesterday morning and basic work-up looked like it was fine.  She was told that it may be a pulled muscle and was given a muscle relaxant.  She states she took that and the pain just got worse.  She describes it as a burning type pain.  She thinks that it is acid reflux.  She is on omeprazole 20 mg daily.  I told her to increase her omeprazole to 20 mg twice daily and to touch base with her family doctor's office tomorrow morning.  Does not sound convincing for acid reflux.  Question if it could be shingles from what she is describing.

## 2022-05-21 DIAGNOSIS — I10 Essential (primary) hypertension: Secondary | ICD-10-CM | POA: Diagnosis not present

## 2022-05-21 DIAGNOSIS — B029 Zoster without complications: Secondary | ICD-10-CM | POA: Diagnosis not present

## 2022-05-21 DIAGNOSIS — R21 Rash and other nonspecific skin eruption: Secondary | ICD-10-CM | POA: Diagnosis not present

## 2022-06-10 ENCOUNTER — Encounter (HOSPITAL_BASED_OUTPATIENT_CLINIC_OR_DEPARTMENT_OTHER): Payer: Self-pay | Admitting: Obstetrics & Gynecology

## 2022-06-10 ENCOUNTER — Ambulatory Visit (INDEPENDENT_AMBULATORY_CARE_PROVIDER_SITE_OTHER): Payer: PPO | Admitting: Obstetrics & Gynecology

## 2022-06-10 VITALS — BP 167/69 | HR 74 | Ht 62.5 in | Wt 149.8 lb

## 2022-06-10 DIAGNOSIS — M8580 Other specified disorders of bone density and structure, unspecified site: Secondary | ICD-10-CM | POA: Diagnosis not present

## 2022-06-10 DIAGNOSIS — B029 Zoster without complications: Secondary | ICD-10-CM | POA: Insufficient documentation

## 2022-06-10 DIAGNOSIS — G4700 Insomnia, unspecified: Secondary | ICD-10-CM | POA: Diagnosis not present

## 2022-06-10 DIAGNOSIS — M546 Pain in thoracic spine: Secondary | ICD-10-CM | POA: Diagnosis not present

## 2022-06-10 DIAGNOSIS — Z9189 Other specified personal risk factors, not elsewhere classified: Secondary | ICD-10-CM

## 2022-06-10 MED ORDER — METHOCARBAMOL 500 MG PO TABS: 500.0000 mg | ORAL_TABLET | Freq: Three times a day (TID) | ORAL | 0 refills | Status: AC

## 2022-06-10 NOTE — Progress Notes (Signed)
81 y.o. G3P3 Widowed White or Caucasian female here for breast and pelvic exam.  Diagnosed with shingles three weeks ago.  She is having neuralgia.  Having some muscle spasm in her back as well.  Has robaxin but doesn't have refill.  Not using 3 times daily as would run out.  Has not been able to get through to Dr. Danna Hefty office.  Feels like the skin is healing.    Denies vaginal bleeding.  Uses small amount of estradiol cream twice weekly.  Last refill in 2021.  She does not need refill.  Using vagisil wash and this has helped as well.  Patient's last menstrual period was 09/16/1993.          Sexually active: No.  H/O STD:  no  Health Maintenance: PCP:  Dr. Dagmar Hait.  Last wellness was in 02/2022.  Did blood work at that time. Vaccines are up to date:  discussed today Colonoscopy:  03/21/2014.  Cologuard 2022. MMG:  01/28/2022 Negative BMD:  01/24/2021 Osteopenia Last pap smear:  06/04/2021 Negative.   H/o abnormal pap smear:  no    reports that she has never smoked. She has never used smokeless tobacco. She reports that she does not drink alcohol and does not use drugs.  Past Medical History:  Diagnosis Date   Broken toe    Clostridium difficile diarrhea    LBBB (left bundle branch block)    02/2014   Osteopenia    may be starting the prolia injection/in between osteopenia and osteoporosis    Past Surgical History:  Procedure Laterality Date   ABDOMINAL HYSTERECTOMY     TAH/BSO   APPENDECTOMY     BREAST BIOPSY Right    benign   TUBAL LIGATION      Current Outpatient Medications  Medication Sig Dispense Refill   acidophilus (RISAQUAD) CAPS capsule Take 1 capsule by mouth.      estradiol (ESTRACE) 0.1 MG/GM vaginal cream Apply small to urethra twice weekly 42.5 g 1   Multiple Vitamins-Minerals (MULTIVITAMIN WITH MINERALS) tablet Take 1 tablet by mouth daily.     omeprazole (PRILOSEC) 20 MG capsule Take 20 mg by mouth daily.     Vitamin D, Cholecalciferol, 25 MCG (1000 UT) CAPS  Take 1 capsule by mouth daily. 60 capsule    FLUZONE HIGH-DOSE QUADRIVALENT 0.7 ML SUSY  (Patient not taking: Reported on 06/10/2022)     methocarbamol (ROBAXIN) 500 MG tablet Take 1 tablet (500 mg total) by mouth 3 (three) times daily. 30 tablet 0   No current facility-administered medications for this visit.    Family History  Problem Relation Age of Onset   Thyroid disease Mother    Breast cancer Maternal Aunt 10   Heart disease Maternal Grandmother    Heart attack Paternal Grandmother    Heart attack Brother    Hodgkin's lymphoma Son 92   Colon cancer Cousin     Review of Systems  Constitutional: Negative.     Exam:   BP (!) 167/69 (BP Location: Left Arm, Patient Position: Sitting, Cuff Size: Normal)   Pulse 74   Ht 5' 2.5" (1.588 m) Comment: Reported  Wt 149 lb 12.8 oz (67.9 kg)   LMP 09/16/1993   BMI 26.96 kg/m   Height: 5' 2.5" (158.8 cm) (Reported)  General appearance: alert, cooperative and appears stated age Breasts: normal appearance, no masses or tenderness Abdomen: soft, non-tender; bowel sounds normal; no masses,  no organomegaly Lymph nodes: Cervical, supraclavicular, and axillary nodes normal.  No abnormal inguinal nodes palpated Neurologic: Grossly normal  Pelvic: External genitalia:  no lesions              Urethra:  normal appearing urethra with no masses, tenderness or lesions              Bartholins and Skenes: normal                 Vagina: normal appearing vagina with atrophic changes and no discharge, no lesions              Cervix: surgically absent              Pap taken: No. Bimanual Exam:  Uterus:  surgically absent              Adnexa: normal adnexa and no mass, fullness, tenderness               Rectovaginal: Confirms               Anus:  normal sphincter tone, no lesions  Chaperone, Octaviano Batty, CMA, was present for exam.  Assessment/Plan: 1. GYN exam for high-risk Medicare patient - Pap smear not indicated - Mammogram done this  year - Colonoscopy 2015, cologuard neg 2022 - Bone mineral density 2022 - lab work done done with PCP - vaccines reviewed/updated  2. Acute left-sided thoracic back pain - methocarbamol (ROBAXIN) 500 MG tablet; Take 1 tablet (500 mg total) by mouth 3 (three) times daily.  Dispense: 30 tablet; Refill: 0  3. Herpes zoster without complication - on Lyrica '25mg'$  and she is trying to take TID  4. Insomnia, unspecified type  5. Osteopenia, unspecified location

## 2022-08-01 DIAGNOSIS — B0229 Other postherpetic nervous system involvement: Secondary | ICD-10-CM | POA: Diagnosis not present

## 2022-08-01 DIAGNOSIS — I10 Essential (primary) hypertension: Secondary | ICD-10-CM | POA: Diagnosis not present

## 2022-08-01 DIAGNOSIS — I447 Left bundle-branch block, unspecified: Secondary | ICD-10-CM | POA: Diagnosis not present

## 2022-08-01 DIAGNOSIS — R55 Syncope and collapse: Secondary | ICD-10-CM | POA: Diagnosis not present

## 2022-08-12 ENCOUNTER — Telehealth (HOSPITAL_BASED_OUTPATIENT_CLINIC_OR_DEPARTMENT_OTHER): Payer: Self-pay | Admitting: Obstetrics & Gynecology

## 2022-08-12 ENCOUNTER — Other Ambulatory Visit (HOSPITAL_COMMUNITY)
Admission: RE | Admit: 2022-08-12 | Discharge: 2022-08-12 | Disposition: A | Discharge status: Home / Self Care | Payer: PPO | Source: Ambulatory Visit | Attending: Obstetrics & Gynecology | Admitting: Obstetrics & Gynecology

## 2022-08-12 ENCOUNTER — Ambulatory Visit (INDEPENDENT_AMBULATORY_CARE_PROVIDER_SITE_OTHER): Payer: PPO

## 2022-08-12 DIAGNOSIS — N898 Other specified noninflammatory disorders of vagina: Secondary | ICD-10-CM | POA: Diagnosis not present

## 2022-08-12 DIAGNOSIS — R350 Frequency of micturition: Secondary | ICD-10-CM | POA: Diagnosis not present

## 2022-08-12 LAB — POCT URINALYSIS DIPSTICK
Bilirubin, UA: NEGATIVE
Blood, UA: NEGATIVE
Glucose, UA: NEGATIVE
Ketones, UA: NEGATIVE
Leukocytes, UA: NEGATIVE
Nitrite, UA: NEGATIVE
Protein, UA: NEGATIVE
Spec Grav, UA: 1.01 (ref 1.010–1.025)
Urobilinogen, UA: 0.2 E.U./dL
pH, UA: 5.5 (ref 5.0–8.0)

## 2022-08-12 NOTE — Telephone Encounter (Signed)
Patient called wanted to know if she can be seen today for yeast infection.

## 2022-08-12 NOTE — Telephone Encounter (Signed)
Pt with complaints of vaginal burning. She states that she feels that she has a UTI or yeast infection. She denies vaginal discharge or itching. Pt provided with appt for urine testing and vaginitis testing.

## 2022-08-12 NOTE — Progress Notes (Signed)
Patient came in today to give a urine sample as well as an aptima self-swab. Patient states that she is experiencing some frequency and vaginal itching. We have sent her urine off for urine culture. Aptima swab sent as well. Patient informed that aptima results should be back tomorrow and urine culture results in a few days. Patient expressed understanding. tbw

## 2022-08-13 LAB — URINE CULTURE

## 2022-08-14 LAB — CERVICOVAGINAL ANCILLARY ONLY
Bacterial Vaginitis (gardnerella): NEGATIVE
Candida Glabrata: NEGATIVE
Candida Vaginitis: NEGATIVE
Comment: NEGATIVE
Comment: NEGATIVE
Comment: NEGATIVE

## 2023-01-21 DIAGNOSIS — H52203 Unspecified astigmatism, bilateral: Secondary | ICD-10-CM | POA: Diagnosis not present

## 2023-01-21 DIAGNOSIS — H2513 Age-related nuclear cataract, bilateral: Secondary | ICD-10-CM | POA: Diagnosis not present

## 2023-01-21 DIAGNOSIS — H25013 Cortical age-related cataract, bilateral: Secondary | ICD-10-CM | POA: Diagnosis not present

## 2023-01-21 DIAGNOSIS — H40033 Anatomical narrow angle, bilateral: Secondary | ICD-10-CM | POA: Diagnosis not present

## 2023-01-21 DIAGNOSIS — H524 Presbyopia: Secondary | ICD-10-CM | POA: Diagnosis not present

## 2023-01-21 DIAGNOSIS — H5203 Hypermetropia, bilateral: Secondary | ICD-10-CM | POA: Diagnosis not present

## 2023-02-03 ENCOUNTER — Other Ambulatory Visit: Payer: Self-pay | Admitting: Obstetrics & Gynecology

## 2023-02-03 DIAGNOSIS — Z1231 Encounter for screening mammogram for malignant neoplasm of breast: Secondary | ICD-10-CM

## 2023-03-10 ENCOUNTER — Ambulatory Visit
Admission: RE | Admit: 2023-03-10 | Discharge: 2023-03-10 | Disposition: A | Discharge status: Home / Self Care | Payer: PPO | Source: Ambulatory Visit | Attending: Obstetrics & Gynecology | Admitting: Obstetrics & Gynecology

## 2023-03-10 DIAGNOSIS — Z1231 Encounter for screening mammogram for malignant neoplasm of breast: Secondary | ICD-10-CM

## 2023-04-15 DIAGNOSIS — R194 Change in bowel habit: Secondary | ICD-10-CM | POA: Diagnosis not present

## 2023-04-15 DIAGNOSIS — K760 Fatty (change of) liver, not elsewhere classified: Secondary | ICD-10-CM | POA: Diagnosis not present

## 2023-04-15 DIAGNOSIS — K219 Gastro-esophageal reflux disease without esophagitis: Secondary | ICD-10-CM | POA: Diagnosis not present

## 2023-04-15 DIAGNOSIS — R1033 Periumbilical pain: Secondary | ICD-10-CM | POA: Diagnosis not present

## 2023-04-15 DIAGNOSIS — Z1211 Encounter for screening for malignant neoplasm of colon: Secondary | ICD-10-CM | POA: Diagnosis not present

## 2023-05-05 DIAGNOSIS — R059 Cough, unspecified: Secondary | ICD-10-CM | POA: Diagnosis not present

## 2023-05-05 DIAGNOSIS — J3489 Other specified disorders of nose and nasal sinuses: Secondary | ICD-10-CM | POA: Diagnosis not present

## 2023-05-05 DIAGNOSIS — R5383 Other fatigue: Secondary | ICD-10-CM | POA: Diagnosis not present

## 2023-05-05 DIAGNOSIS — I10 Essential (primary) hypertension: Secondary | ICD-10-CM | POA: Diagnosis not present

## 2023-05-05 DIAGNOSIS — H04203 Unspecified epiphora, bilateral lacrimal glands: Secondary | ICD-10-CM | POA: Diagnosis not present

## 2023-05-05 DIAGNOSIS — R110 Nausea: Secondary | ICD-10-CM | POA: Diagnosis not present

## 2023-05-05 DIAGNOSIS — Z1152 Encounter for screening for COVID-19: Secondary | ICD-10-CM | POA: Diagnosis not present

## 2023-05-05 DIAGNOSIS — U071 COVID-19: Secondary | ICD-10-CM | POA: Diagnosis not present

## 2023-05-26 DIAGNOSIS — E040 Nontoxic diffuse goiter: Secondary | ICD-10-CM | POA: Diagnosis not present

## 2023-05-26 DIAGNOSIS — M810 Age-related osteoporosis without current pathological fracture: Secondary | ICD-10-CM | POA: Diagnosis not present

## 2023-05-26 DIAGNOSIS — I10 Essential (primary) hypertension: Secondary | ICD-10-CM | POA: Diagnosis not present

## 2023-05-26 DIAGNOSIS — E785 Hyperlipidemia, unspecified: Secondary | ICD-10-CM | POA: Diagnosis not present

## 2023-06-09 DIAGNOSIS — M1990 Unspecified osteoarthritis, unspecified site: Secondary | ICD-10-CM | POA: Diagnosis not present

## 2023-06-09 DIAGNOSIS — I447 Left bundle-branch block, unspecified: Secondary | ICD-10-CM | POA: Diagnosis not present

## 2023-06-09 DIAGNOSIS — M810 Age-related osteoporosis without current pathological fracture: Secondary | ICD-10-CM | POA: Diagnosis not present

## 2023-06-09 DIAGNOSIS — Z0000 Encounter for general adult medical examination without abnormal findings: Secondary | ICD-10-CM | POA: Diagnosis not present

## 2023-06-09 DIAGNOSIS — E785 Hyperlipidemia, unspecified: Secondary | ICD-10-CM | POA: Diagnosis not present

## 2023-06-09 DIAGNOSIS — I872 Venous insufficiency (chronic) (peripheral): Secondary | ICD-10-CM | POA: Diagnosis not present

## 2023-06-09 DIAGNOSIS — M1711 Unilateral primary osteoarthritis, right knee: Secondary | ICD-10-CM | POA: Diagnosis not present

## 2023-06-09 DIAGNOSIS — I10 Essential (primary) hypertension: Secondary | ICD-10-CM | POA: Diagnosis not present

## 2023-06-09 DIAGNOSIS — Z1331 Encounter for screening for depression: Secondary | ICD-10-CM | POA: Diagnosis not present

## 2023-06-09 DIAGNOSIS — B029 Zoster without complications: Secondary | ICD-10-CM | POA: Diagnosis not present

## 2023-06-09 DIAGNOSIS — Z1339 Encounter for screening examination for other mental health and behavioral disorders: Secondary | ICD-10-CM | POA: Diagnosis not present

## 2023-06-09 DIAGNOSIS — J309 Allergic rhinitis, unspecified: Secondary | ICD-10-CM | POA: Diagnosis not present

## 2023-06-09 DIAGNOSIS — R82998 Other abnormal findings in urine: Secondary | ICD-10-CM | POA: Diagnosis not present

## 2023-06-23 DIAGNOSIS — M1990 Unspecified osteoarthritis, unspecified site: Secondary | ICD-10-CM | POA: Diagnosis not present

## 2023-06-23 DIAGNOSIS — M79642 Pain in left hand: Secondary | ICD-10-CM | POA: Diagnosis not present

## 2023-06-30 ENCOUNTER — Ambulatory Visit (HOSPITAL_BASED_OUTPATIENT_CLINIC_OR_DEPARTMENT_OTHER): Payer: PPO

## 2023-06-30 ENCOUNTER — Other Ambulatory Visit (HOSPITAL_COMMUNITY)
Admission: RE | Admit: 2023-06-30 | Discharge: 2023-06-30 | Disposition: A | Discharge status: Home / Self Care | Payer: PPO | Source: Ambulatory Visit | Attending: Obstetrics & Gynecology | Admitting: Obstetrics & Gynecology

## 2023-06-30 ENCOUNTER — Ambulatory Visit (HOSPITAL_BASED_OUTPATIENT_CLINIC_OR_DEPARTMENT_OTHER): Payer: PPO | Admitting: Obstetrics & Gynecology

## 2023-06-30 ENCOUNTER — Encounter (HOSPITAL_BASED_OUTPATIENT_CLINIC_OR_DEPARTMENT_OTHER): Payer: Self-pay

## 2023-06-30 VITALS — BP 135/46 | HR 70 | Ht 62.5 in | Wt 147.6 lb

## 2023-06-30 DIAGNOSIS — R350 Frequency of micturition: Secondary | ICD-10-CM | POA: Diagnosis not present

## 2023-06-30 DIAGNOSIS — N898 Other specified noninflammatory disorders of vagina: Secondary | ICD-10-CM | POA: Insufficient documentation

## 2023-06-30 LAB — POCT URINALYSIS DIPSTICK
Bilirubin, UA: NEGATIVE
Blood, UA: NEGATIVE
Glucose, UA: NEGATIVE
Ketones, UA: NEGATIVE
Leukocytes, UA: NEGATIVE
Nitrite, UA: NEGATIVE
Protein, UA: NEGATIVE
Spec Grav, UA: 1.025 (ref 1.010–1.025)
Urobilinogen, UA: NEGATIVE U/dL — AB
pH, UA: 6 (ref 5.0–8.0)

## 2023-06-30 NOTE — Addendum Note (Signed)
Addended by: Hendricks Milo on: 06/30/2023 02:55 PM   Modules accepted: Orders

## 2023-06-30 NOTE — Progress Notes (Signed)
Pt presented to office with symptoms of burning in the vaginal area and lower abdomen pain sometimes. She also stated she has itching too, pt was instructed on and performed self swab and clean catch urine.

## 2023-07-01 LAB — CERVICOVAGINAL ANCILLARY ONLY
Bacterial Vaginitis (gardnerella): NEGATIVE
Candida Glabrata: NEGATIVE
Candida Vaginitis: NEGATIVE
Comment: NEGATIVE
Comment: NEGATIVE
Comment: NEGATIVE

## 2023-07-02 LAB — URINE CULTURE: Organism ID, Bacteria: NO GROWTH

## 2023-08-25 DIAGNOSIS — X32XXXD Exposure to sunlight, subsequent encounter: Secondary | ICD-10-CM | POA: Diagnosis not present

## 2023-08-25 DIAGNOSIS — L570 Actinic keratosis: Secondary | ICD-10-CM | POA: Diagnosis not present

## 2023-08-25 DIAGNOSIS — L820 Inflamed seborrheic keratosis: Secondary | ICD-10-CM | POA: Diagnosis not present

## 2024-01-13 DIAGNOSIS — I872 Venous insufficiency (chronic) (peripheral): Secondary | ICD-10-CM | POA: Diagnosis not present

## 2024-01-13 DIAGNOSIS — I10 Essential (primary) hypertension: Secondary | ICD-10-CM | POA: Diagnosis not present

## 2024-01-13 DIAGNOSIS — M545 Low back pain, unspecified: Secondary | ICD-10-CM | POA: Diagnosis not present

## 2024-01-13 DIAGNOSIS — F419 Anxiety disorder, unspecified: Secondary | ICD-10-CM | POA: Diagnosis not present

## 2024-01-13 DIAGNOSIS — R29898 Other symptoms and signs involving the musculoskeletal system: Secondary | ICD-10-CM | POA: Diagnosis not present

## 2024-01-22 DIAGNOSIS — E785 Hyperlipidemia, unspecified: Secondary | ICD-10-CM | POA: Diagnosis not present

## 2024-01-22 DIAGNOSIS — M810 Age-related osteoporosis without current pathological fracture: Secondary | ICD-10-CM | POA: Diagnosis not present

## 2024-01-22 DIAGNOSIS — B029 Zoster without complications: Secondary | ICD-10-CM | POA: Diagnosis not present

## 2024-01-22 DIAGNOSIS — I872 Venous insufficiency (chronic) (peripheral): Secondary | ICD-10-CM | POA: Diagnosis not present

## 2024-01-22 DIAGNOSIS — F419 Anxiety disorder, unspecified: Secondary | ICD-10-CM | POA: Diagnosis not present

## 2024-01-22 DIAGNOSIS — E040 Nontoxic diffuse goiter: Secondary | ICD-10-CM | POA: Diagnosis not present

## 2024-01-22 DIAGNOSIS — M545 Low back pain, unspecified: Secondary | ICD-10-CM | POA: Diagnosis not present

## 2024-01-22 DIAGNOSIS — I447 Left bundle-branch block, unspecified: Secondary | ICD-10-CM | POA: Diagnosis not present

## 2024-01-22 DIAGNOSIS — K589 Irritable bowel syndrome without diarrhea: Secondary | ICD-10-CM | POA: Diagnosis not present

## 2024-01-22 DIAGNOSIS — K219 Gastro-esophageal reflux disease without esophagitis: Secondary | ICD-10-CM | POA: Diagnosis not present

## 2024-01-22 DIAGNOSIS — I10 Essential (primary) hypertension: Secondary | ICD-10-CM | POA: Diagnosis not present

## 2024-01-22 DIAGNOSIS — R29898 Other symptoms and signs involving the musculoskeletal system: Secondary | ICD-10-CM | POA: Diagnosis not present

## 2024-02-23 DIAGNOSIS — K629 Disease of anus and rectum, unspecified: Secondary | ICD-10-CM | POA: Diagnosis not present

## 2024-02-23 DIAGNOSIS — K625 Hemorrhage of anus and rectum: Secondary | ICD-10-CM | POA: Diagnosis not present

## 2024-02-23 DIAGNOSIS — K589 Irritable bowel syndrome without diarrhea: Secondary | ICD-10-CM | POA: Diagnosis not present

## 2024-02-23 DIAGNOSIS — I10 Essential (primary) hypertension: Secondary | ICD-10-CM | POA: Diagnosis not present

## 2024-02-24 DIAGNOSIS — K219 Gastro-esophageal reflux disease without esophagitis: Secondary | ICD-10-CM | POA: Diagnosis not present

## 2024-02-24 DIAGNOSIS — K625 Hemorrhage of anus and rectum: Secondary | ICD-10-CM | POA: Diagnosis not present

## 2024-02-24 DIAGNOSIS — K645 Perianal venous thrombosis: Secondary | ICD-10-CM | POA: Diagnosis not present

## 2024-02-24 DIAGNOSIS — K760 Fatty (change of) liver, not elsewhere classified: Secondary | ICD-10-CM | POA: Diagnosis not present

## 2024-03-17 ENCOUNTER — Other Ambulatory Visit: Payer: Self-pay | Admitting: Obstetrics & Gynecology

## 2024-03-17 DIAGNOSIS — Z1231 Encounter for screening mammogram for malignant neoplasm of breast: Secondary | ICD-10-CM

## 2024-03-30 ENCOUNTER — Ambulatory Visit

## 2024-03-31 ENCOUNTER — Ambulatory Visit

## 2024-04-02 DIAGNOSIS — M545 Low back pain, unspecified: Secondary | ICD-10-CM | POA: Diagnosis not present

## 2024-04-02 DIAGNOSIS — K629 Disease of anus and rectum, unspecified: Secondary | ICD-10-CM | POA: Diagnosis not present

## 2024-04-02 DIAGNOSIS — I872 Venous insufficiency (chronic) (peripheral): Secondary | ICD-10-CM | POA: Diagnosis not present

## 2024-04-02 DIAGNOSIS — I10 Essential (primary) hypertension: Secondary | ICD-10-CM | POA: Diagnosis not present

## 2024-04-02 DIAGNOSIS — I447 Left bundle-branch block, unspecified: Secondary | ICD-10-CM | POA: Diagnosis not present

## 2024-04-02 DIAGNOSIS — E785 Hyperlipidemia, unspecified: Secondary | ICD-10-CM | POA: Diagnosis not present

## 2024-04-02 DIAGNOSIS — M1711 Unilateral primary osteoarthritis, right knee: Secondary | ICD-10-CM | POA: Diagnosis not present

## 2024-04-02 DIAGNOSIS — M810 Age-related osteoporosis without current pathological fracture: Secondary | ICD-10-CM | POA: Diagnosis not present

## 2024-04-02 DIAGNOSIS — K219 Gastro-esophageal reflux disease without esophagitis: Secondary | ICD-10-CM | POA: Diagnosis not present

## 2024-04-02 DIAGNOSIS — F419 Anxiety disorder, unspecified: Secondary | ICD-10-CM | POA: Diagnosis not present

## 2024-04-02 DIAGNOSIS — S4360XA Sprain of unspecified sternoclavicular joint, initial encounter: Secondary | ICD-10-CM | POA: Diagnosis not present

## 2024-04-02 DIAGNOSIS — B029 Zoster without complications: Secondary | ICD-10-CM | POA: Diagnosis not present

## 2024-04-06 ENCOUNTER — Ambulatory Visit

## 2024-04-14 DIAGNOSIS — H5203 Hypermetropia, bilateral: Secondary | ICD-10-CM | POA: Diagnosis not present

## 2024-04-14 DIAGNOSIS — H25013 Cortical age-related cataract, bilateral: Secondary | ICD-10-CM | POA: Diagnosis not present

## 2024-04-14 DIAGNOSIS — H40033 Anatomical narrow angle, bilateral: Secondary | ICD-10-CM | POA: Diagnosis not present

## 2024-04-14 DIAGNOSIS — H2513 Age-related nuclear cataract, bilateral: Secondary | ICD-10-CM | POA: Diagnosis not present

## 2024-04-14 DIAGNOSIS — H524 Presbyopia: Secondary | ICD-10-CM | POA: Diagnosis not present

## 2024-04-15 DIAGNOSIS — K449 Diaphragmatic hernia without obstruction or gangrene: Secondary | ICD-10-CM | POA: Diagnosis not present

## 2024-04-15 DIAGNOSIS — K219 Gastro-esophageal reflux disease without esophagitis: Secondary | ICD-10-CM | POA: Diagnosis not present

## 2024-04-15 DIAGNOSIS — R140 Abdominal distension (gaseous): Secondary | ICD-10-CM | POA: Diagnosis not present

## 2024-04-15 DIAGNOSIS — K760 Fatty (change of) liver, not elsewhere classified: Secondary | ICD-10-CM | POA: Diagnosis not present

## 2024-04-23 DIAGNOSIS — R143 Flatulence: Secondary | ICD-10-CM | POA: Diagnosis not present

## 2024-04-23 DIAGNOSIS — K219 Gastro-esophageal reflux disease without esophagitis: Secondary | ICD-10-CM | POA: Diagnosis not present

## 2024-04-23 DIAGNOSIS — I10 Essential (primary) hypertension: Secondary | ICD-10-CM | POA: Diagnosis not present

## 2024-04-23 DIAGNOSIS — K625 Hemorrhage of anus and rectum: Secondary | ICD-10-CM | POA: Diagnosis not present

## 2024-04-23 DIAGNOSIS — R197 Diarrhea, unspecified: Secondary | ICD-10-CM | POA: Diagnosis not present

## 2024-04-23 DIAGNOSIS — B029 Zoster without complications: Secondary | ICD-10-CM | POA: Diagnosis not present

## 2024-04-23 DIAGNOSIS — K629 Disease of anus and rectum, unspecified: Secondary | ICD-10-CM | POA: Diagnosis not present

## 2024-04-23 DIAGNOSIS — K580 Irritable bowel syndrome with diarrhea: Secondary | ICD-10-CM | POA: Diagnosis not present

## 2024-04-23 DIAGNOSIS — W57XXXA Bitten or stung by nonvenomous insect and other nonvenomous arthropods, initial encounter: Secondary | ICD-10-CM | POA: Diagnosis not present

## 2024-05-04 DIAGNOSIS — R197 Diarrhea, unspecified: Secondary | ICD-10-CM | POA: Diagnosis not present

## 2024-05-26 DIAGNOSIS — X32XXXD Exposure to sunlight, subsequent encounter: Secondary | ICD-10-CM | POA: Diagnosis not present

## 2024-05-26 DIAGNOSIS — L570 Actinic keratosis: Secondary | ICD-10-CM | POA: Diagnosis not present

## 2024-06-08 ENCOUNTER — Telehealth (HOSPITAL_BASED_OUTPATIENT_CLINIC_OR_DEPARTMENT_OTHER): Payer: Self-pay

## 2024-06-08 ENCOUNTER — Ambulatory Visit
Admission: RE | Admit: 2024-06-08 | Discharge: 2024-06-08 | Disposition: A | Discharge status: Home / Self Care | Source: Ambulatory Visit | Attending: Obstetrics & Gynecology | Admitting: Obstetrics & Gynecology

## 2024-06-08 DIAGNOSIS — Z1231 Encounter for screening mammogram for malignant neoplasm of breast: Secondary | ICD-10-CM

## 2024-06-08 NOTE — Telephone Encounter (Signed)
 Patient left a voicemail on the nurse line requesting a return call regarding redness around right breast. Spoke with patient. Patient was seen at [facility] for her screening mammogram today and she noticed a red ring around her right areola. States she is very upset. Denies any pain, streaking, warmth to the breast, swelling, or nipple discharge. Patient states she was advised at her screening mammogram that the redness was not seen. She would like to be seen with Dr.Miller for further evaluation. Appointment scheduled for 06/09/24 at 9:15 am. Patient is agreeable to date and time.

## 2024-06-09 ENCOUNTER — Ambulatory Visit (INDEPENDENT_AMBULATORY_CARE_PROVIDER_SITE_OTHER): Admitting: Obstetrics & Gynecology

## 2024-06-09 ENCOUNTER — Encounter (HOSPITAL_BASED_OUTPATIENT_CLINIC_OR_DEPARTMENT_OTHER): Payer: Self-pay | Admitting: Obstetrics & Gynecology

## 2024-06-09 VITALS — BP 142/78 | HR 74 | Ht 63.0 in | Wt 134.6 lb

## 2024-06-09 DIAGNOSIS — L539 Erythematous condition, unspecified: Secondary | ICD-10-CM | POA: Diagnosis not present

## 2024-06-09 DIAGNOSIS — Z1211 Encounter for screening for malignant neoplasm of colon: Secondary | ICD-10-CM

## 2024-06-09 NOTE — Progress Notes (Signed)
 GYNECOLOGY  VISIT  CC:   breast concern  HPI: 83 y.o. G3P3 Widowed White or Caucasian female here for right breast redness around areola that was noticed by the radiology tech when she had her mammogram yesterday.  Erythema around her areola was noted.  Denies any itching.  Denies trauma or bruising.  Denies mass or pain.  Mammogram result is still pending.  Very anxious about this.  Didn't sleep much last night.  Also having increased gas and a lot of loose stool in the morning.  This seemed to start after having a tooth extracted and she took ibuprofen.  H/o IBS.  Did not take any antibiotics.  Did see Dr. Kristie.  Took Ultra-flora for 1 month and now on a daily probiotic.  This hasn't helped.  Called Dr. Nola office for follow up and they refused to make another appointment.  She has tried Tums about a week ago and she feels this helped a little bit.  Still having the same stomach burgling starting around 6am and then has loose stool in the morning.    Would like cologuard ordered.  Done 2022.  Does not want to have another colonoscopy.      Past Medical History:  Diagnosis Date   Broken toe    Clostridium difficile diarrhea    LBBB (left bundle branch block)    02/2014   Osteopenia    may be starting the prolia  injection/in between osteopenia and osteoporosis    MEDS:   Current Outpatient Medications on File Prior to Visit  Medication Sig Dispense Refill   acidophilus (RISAQUAD) CAPS capsule Take 1 capsule by mouth.      estradiol  (ESTRACE ) 0.1 MG/GM vaginal cream Apply small to urethra twice weekly 42.5 g 1   Multiple Vitamins-Minerals (MULTIVITAMIN WITH MINERALS) tablet Take 1 tablet by mouth daily.     omeprazole (PRILOSEC) 20 MG capsule Take 20 mg by mouth daily.     Vitamin D , Cholecalciferol , 25 MCG (1000 UT) CAPS Take 1 capsule by mouth daily. 60 capsule    FLUZONE HIGH-DOSE QUADRIVALENT 0.7 ML SUSY  (Patient not taking: Reported on 06/10/2022)     methocarbamol  (ROBAXIN ) 500  MG tablet Take 1 tablet (500 mg total) by mouth 3 (three) times daily. (Patient not taking: Reported on 06/09/2024) 30 tablet 0   No current facility-administered medications on file prior to visit.    ALLERGIES: Ciprofloxacin, Darvon, Escitalopram, Mtx support [cobalamin combinations], and Penicillin g sodium  SH:  widowed, non smoker  Review of Systems  Constitutional: Negative.     PHYSICAL EXAMINATION:    BP (!) 142/78 (BP Location: Left Arm, Patient Position: Sitting, Cuff Size: Normal)   Pulse 74   Ht 5' 3 (1.6 m)   Wt 134 lb 9.6 oz (61.1 kg)   LMP 09/16/1993   SpO2 99%   BMI 23.84 kg/m     Physical Exam Constitutional:      Appearance: Normal appearance.  Chest:  Breasts:    Right: Skin change present. No swelling, bleeding, inverted nipple, mass, nipple discharge or tenderness.     Left: No swelling, bleeding, inverted nipple, mass, nipple discharge, skin change or tenderness.    Lymphadenopathy:     Upper Body:     Right upper body: No supraclavicular or axillary adenopathy.     Left upper body: No supraclavicular or axillary adenopathy.  Neurological:     Mental Status: She is alert.    Assessment/Plan: 1. Skin erythema (Primary) - skin  erythema resolved while doing exam.  Discussed with pt possibly this is skin irritation?  She has been using a new detergent because All free and clear has gotten more expensive.  Suggested washing undergarments twice.   - awaiting mammogram results  2. Colon cancer screening - Cologuard - will check with Dr. Nola office about pt being seen.  If declines, will refer to new GI.

## 2024-06-10 ENCOUNTER — Ambulatory Visit (HOSPITAL_BASED_OUTPATIENT_CLINIC_OR_DEPARTMENT_OTHER): Payer: Self-pay | Admitting: Obstetrics & Gynecology

## 2024-06-14 ENCOUNTER — Telehealth (HOSPITAL_BASED_OUTPATIENT_CLINIC_OR_DEPARTMENT_OTHER): Payer: Self-pay

## 2024-06-14 NOTE — Telephone Encounter (Signed)
 Returned call to patient and advised that Mammogram results were negative.   Morna LOISE Quale, RN

## 2024-06-14 NOTE — Telephone Encounter (Signed)
 Pt said that someone just called her, she is now available if they would like to call her back.

## 2024-06-14 NOTE — Telephone Encounter (Signed)
 Pt would like for Dr. Cleotilde to order her a bone density test.  Would like for someone to give her a call if she is able to do it.

## 2024-06-14 NOTE — Telephone Encounter (Signed)
 Spoke with patient and advised that Mammogram results were negative.   Sylvia LOISE Quale, RN

## 2024-06-15 DIAGNOSIS — K760 Fatty (change of) liver, not elsewhere classified: Secondary | ICD-10-CM | POA: Diagnosis not present

## 2024-06-15 DIAGNOSIS — R140 Abdominal distension (gaseous): Secondary | ICD-10-CM | POA: Diagnosis not present

## 2024-06-15 DIAGNOSIS — K219 Gastro-esophageal reflux disease without esophagitis: Secondary | ICD-10-CM | POA: Diagnosis not present

## 2024-06-15 DIAGNOSIS — K449 Diaphragmatic hernia without obstruction or gangrene: Secondary | ICD-10-CM | POA: Diagnosis not present

## 2024-06-15 NOTE — Telephone Encounter (Signed)
 Left message for patient that we will place order for bone density test. Requested that patient call us  back to discuss where she would like to have test done.   Morna LOISE Quale, RN

## 2024-06-18 ENCOUNTER — Encounter (HOSPITAL_COMMUNITY): Payer: Self-pay

## 2024-06-18 ENCOUNTER — Emergency Department (HOSPITAL_COMMUNITY)
Admission: EM | Admit: 2024-06-18 | Discharge: 2024-06-18 | Disposition: A | Discharge status: Home / Self Care | Attending: Emergency Medicine | Admitting: Emergency Medicine

## 2024-06-18 ENCOUNTER — Other Ambulatory Visit: Payer: Self-pay

## 2024-06-18 DIAGNOSIS — K589 Irritable bowel syndrome without diarrhea: Secondary | ICD-10-CM | POA: Diagnosis not present

## 2024-06-18 DIAGNOSIS — E860 Dehydration: Secondary | ICD-10-CM | POA: Insufficient documentation

## 2024-06-18 DIAGNOSIS — R109 Unspecified abdominal pain: Secondary | ICD-10-CM | POA: Diagnosis present

## 2024-06-18 LAB — COMPREHENSIVE METABOLIC PANEL WITH GFR
ALT: 18 U/L (ref 0–44)
AST: 34 U/L (ref 15–41)
Albumin: 4.1 g/dL (ref 3.5–5.0)
Alkaline Phosphatase: 55 U/L (ref 38–126)
Anion gap: 10 (ref 5–15)
BUN: 9 mg/dL (ref 8–23)
CO2: 24 mmol/L (ref 22–32)
Calcium: 9.5 mg/dL (ref 8.9–10.3)
Chloride: 105 mmol/L (ref 98–111)
Creatinine, Ser: 1.03 mg/dL — ABNORMAL HIGH (ref 0.44–1.00)
GFR, Estimated: 55 mL/min — ABNORMAL LOW (ref >60)
Glucose, Bld: 96 mg/dL (ref 70–99)
Potassium: 3.7 mmol/L (ref 3.5–5.1)
Sodium: 139 mmol/L (ref 135–145)
Total Bilirubin: 1.8 mg/dL — ABNORMAL HIGH (ref 0.0–1.2)
Total Protein: 7.2 g/dL (ref 6.5–8.1)

## 2024-06-18 LAB — LIPASE, BLOOD: Lipase: 55 U/L — ABNORMAL HIGH (ref 11–51)

## 2024-06-18 LAB — CBC
HCT: 42.5 % (ref 36.0–46.0)
Hemoglobin: 14.2 g/dL (ref 12.0–15.0)
MCH: 30 pg (ref 26.0–34.0)
MCHC: 33.4 g/dL (ref 30.0–36.0)
MCV: 89.9 fL (ref 80.0–100.0)
Platelets: 250 K/uL (ref 150–400)
RBC: 4.73 MIL/uL (ref 3.87–5.11)
RDW: 12.3 % (ref 11.5–15.5)
WBC: 8.4 K/uL (ref 4.0–10.5)
nRBC: 0 % (ref 0.0–0.2)

## 2024-06-18 MED ORDER — LACTATED RINGERS IV BOLUS: 1000.0000 mL | Freq: Once | INTRAVENOUS | Status: DC

## 2024-06-18 NOTE — ED Triage Notes (Signed)
 Pt reports that she has had stomach problemsfor 5 or 6 weeks. Pt reports that she has gas that wakes her from her sleep. Pt reports that she has seen GP and GI. Pt reports that last night it was worse than normal.

## 2024-06-18 NOTE — ED Provider Notes (Signed)
 [place] EMERGENCY DEPARTMENT AT [facility] Provider Note   CSN: [id number] Arrival date & time: 06/18/24  1523     Patient presents with: Abdominal Pain and Back Pain   Sylvia Kennedy is a 83 y.o. female.  Patient is a 83 year old female with MH of GERD, prior C. difficile infection, and IBS following with Dr. Kristie, presenting to the ED with 5 to 6 weeks of cramping abdominal pain with bloating/gas and significant flatulence only at night -patient reports that she has followed with her PCP GI doctor for the symptoms.  She reports that previously she was on Tums with symptomatic improvement.  She reports that her PCP recently collected a stool sample for GIP.  Patient denies recent fevers, chills, nausea, vomiting, abdominal pain during daytime, suprapubic pain, changes in urination, changes in bowel movements.  He denies any chest pain or shortness of breath.  Patient reports recently being initiated on Gas-X by GI with minimal symptom improvement.    Prior to Admission medications   Medication Sig Start Date End Date Taking? Authorizing Provider  acidophilus (RISAQUAD) CAPS capsule Take 1 capsule by mouth.     [provider]  estradiol  (ESTRACE ) 0.1 MG/GM vaginal cream Apply small to urethra twice weekly 06/22/20   Cleotilde Ronal RAMAN, MD  FLUZONE HIGH-DOSE QUADRIVALENT 0.7 ML SUSY  06/13/20   [provider]  methocarbamol  (ROBAXIN ) 500 MG tablet Take 1 tablet (500 mg total) by mouth 3 (three) times daily. Patient not taking: Reported on 06/09/2024 06/10/22   Cleotilde Ronal RAMAN, MD  Multiple Vitamins-Minerals (MULTIVITAMIN WITH MINERALS) tablet Take 1 tablet by mouth daily.    [provider]  omeprazole (PRILOSEC) 20 MG capsule Take 20 mg by mouth daily. 07/03/20   [provider]  Vitamin D , Cholecalciferol , 25 MCG (1000 UT) CAPS Take 1 capsule by mouth daily. 06/04/21   Cleotilde Ronal RAMAN, MD    Allergies: Ciprofloxacin, Darvon, Escitalopram, Mtx  support [cobalamin combinations], and Penicillin g sodium    Review of Systems  Gastrointestinal:  Positive for abdominal pain.  Musculoskeletal:  Positive for back pain.    Updated Vital Signs BP (!) 152/68   Pulse 74   Temp 97.9 F (36.6 C) (Oral)   Resp 17   Ht 5' 3 (1.6 m)   Wt 60.8 kg   LMP 09/16/1993   SpO2 100%   BMI 23.74 kg/m   Physical Exam Constitutional:      Appearance: She is well-developed.  HENT:     Head: Normocephalic and atraumatic.     Left Ear: External ear normal.     Nose: Nose normal.     Mouth/Throat:     Mouth: Mucous membranes are dry.     Pharynx: Oropharynx is clear.  Eyes:     Extraocular Movements: Extraocular movements intact.     Pupils: Pupils are equal, round, and reactive to light.  Cardiovascular:     Rate and Rhythm: Normal rate and regular rhythm.  Pulmonary:     Effort: Pulmonary effort is normal.     Breath sounds: Normal breath sounds.  Abdominal:     General: Abdomen is flat. Bowel sounds are normal.     Palpations: Abdomen is rigid.  Musculoskeletal:        General: Normal range of motion.  Skin:    General: Skin is warm and dry.     Capillary Refill: Capillary refill takes 2 to 3 seconds.  Neurological:     Mental  Status: She is alert.     (all labs ordered are listed, but only abnormal results are displayed) Labs Reviewed  LIPASE, BLOOD - Abnormal; Notable for the following components:      Result Value   Lipase 55 (*)    All other components within normal limits  COMPREHENSIVE METABOLIC PANEL WITH GFR - Abnormal; Notable for the following components:   Creatinine, Ser 1.03 (*)    Total Bilirubin 1.8 (*)    GFR, Estimated 55 (*)    All other components within normal limits  CBC    EKG: None  Radiology: No results found.  Procedures   Medications Ordered in the ED  lactated ringers bolus 1,000 mL (1,000 mLs Intravenous Not Given 06/18/24 2042)      Medical Decision Making Amount and/or  Complexity of Data Reviewed Labs: ordered.   83 year old female with PMH of IBS following with Dr. Kristie of gastroenterology presenting to the ED with chief complaint of 6 weeks of nocturnal gas/flatus with mild cramping abdominal pain, disrupting her sleep pattern, without associated significant abdominal discomfort during daytime, nausea, vomiting, changes in urination, fevers, chills, or other infectious symptoms.  Last Cologuard 2022.  Upon arrival, patient hemodynamically stable in no acute distress.  Hypertensive 156/65.  Afebrile.  No tachycardia or tachypnea.  Saturating 100% RA.  GCS 15.  Patient well-appearing, nontoxic.  Abdominal examination benign.  No focal tenderness.  No rebound or guarding.  No CVA tenderness to percussion.  Patient clinically mildly dehydrated with 2 to 3-second capillary refill and dry oromucosa.  Notably, patient recently initiated on ultra flora x 1 month and on daily probiotic with minimal relief, however reports some symptomatic improvement with Tums.  Potential dietary carbohydrate intolerance considered and restricted diet discussed with patient.  Given history of IBS, and symptoms tied to circadian changes, favored etiology is ongoing symptomatic IBS.  CBC without leukocytosis.  Metabolic panel without evidence of clinically significant electrolytic derangement.  Creatinine 1.03, not significantly elevated.  Potassium and sodium WNL.  At this time, favored etiology is IBS.  Patient encouraged to follow-up with PCP as well as outpatient GI for ongoing medical management.  Benign abdominal examination and overall nontoxic appearance without recent reported infectious symptoms, low suspicion for acute intra-abdominal processes, however strict return precautions discussed.  All questions answered at bedside.  At this time, patient hemodynamically stable and appropriate for discharge.  Final diagnoses:  Dehydration  Irritable bowel syndrome without diarrhea     ED Discharge Orders     None          Keyry Iracheta, Elsie, MD 06/18/24 2159    Ruthe Cornet, DO 06/18/24 2230

## 2024-06-18 NOTE — ED Notes (Signed)
 RN to bedside to place IV and initiate IVF. Pt refusing IV at this time and states I will just drink plenty of water. MD made aware

## 2024-06-23 DIAGNOSIS — E785 Hyperlipidemia, unspecified: Secondary | ICD-10-CM | POA: Diagnosis not present

## 2024-06-23 DIAGNOSIS — M810 Age-related osteoporosis without current pathological fracture: Secondary | ICD-10-CM | POA: Diagnosis not present

## 2024-06-23 DIAGNOSIS — E040 Nontoxic diffuse goiter: Secondary | ICD-10-CM | POA: Diagnosis not present

## 2024-06-23 DIAGNOSIS — Z0189 Encounter for other specified special examinations: Secondary | ICD-10-CM | POA: Diagnosis not present

## 2024-06-23 DIAGNOSIS — I10 Essential (primary) hypertension: Secondary | ICD-10-CM | POA: Diagnosis not present

## 2024-06-23 DIAGNOSIS — K219 Gastro-esophageal reflux disease without esophagitis: Secondary | ICD-10-CM | POA: Diagnosis not present

## 2024-06-30 DIAGNOSIS — Z23 Encounter for immunization: Secondary | ICD-10-CM | POA: Diagnosis not present

## 2024-06-30 DIAGNOSIS — K219 Gastro-esophageal reflux disease without esophagitis: Secondary | ICD-10-CM | POA: Diagnosis not present

## 2024-06-30 DIAGNOSIS — E785 Hyperlipidemia, unspecified: Secondary | ICD-10-CM | POA: Diagnosis not present

## 2024-06-30 DIAGNOSIS — B029 Zoster without complications: Secondary | ICD-10-CM | POA: Diagnosis not present

## 2024-06-30 DIAGNOSIS — R82998 Other abnormal findings in urine: Secondary | ICD-10-CM | POA: Diagnosis not present

## 2024-06-30 DIAGNOSIS — N1831 Chronic kidney disease, stage 3a: Secondary | ICD-10-CM | POA: Diagnosis not present

## 2024-06-30 DIAGNOSIS — Z1331 Encounter for screening for depression: Secondary | ICD-10-CM | POA: Diagnosis not present

## 2024-06-30 DIAGNOSIS — Z1339 Encounter for screening examination for other mental health and behavioral disorders: Secondary | ICD-10-CM | POA: Diagnosis not present

## 2024-06-30 DIAGNOSIS — I129 Hypertensive chronic kidney disease with stage 1 through stage 4 chronic kidney disease, or unspecified chronic kidney disease: Secondary | ICD-10-CM | POA: Diagnosis not present

## 2024-06-30 DIAGNOSIS — Z0000 Encounter for general adult medical examination without abnormal findings: Secondary | ICD-10-CM | POA: Diagnosis not present

## 2024-06-30 DIAGNOSIS — I872 Venous insufficiency (chronic) (peripheral): Secondary | ICD-10-CM | POA: Diagnosis not present

## 2024-06-30 DIAGNOSIS — F419 Anxiety disorder, unspecified: Secondary | ICD-10-CM | POA: Diagnosis not present

## 2024-06-30 DIAGNOSIS — J309 Allergic rhinitis, unspecified: Secondary | ICD-10-CM | POA: Diagnosis not present

## 2024-07-06 ENCOUNTER — Encounter (HOSPITAL_BASED_OUTPATIENT_CLINIC_OR_DEPARTMENT_OTHER): Payer: Self-pay | Admitting: Obstetrics & Gynecology

## 2024-07-06 ENCOUNTER — Ambulatory Visit (HOSPITAL_BASED_OUTPATIENT_CLINIC_OR_DEPARTMENT_OTHER): Admitting: Obstetrics & Gynecology

## 2024-07-06 VITALS — BP 132/63 | HR 66 | Ht 63.0 in | Wt 131.6 lb

## 2024-07-06 DIAGNOSIS — Z9189 Other specified personal risk factors, not elsewhere classified: Secondary | ICD-10-CM

## 2024-07-06 DIAGNOSIS — M8580 Other specified disorders of bone density and structure, unspecified site: Secondary | ICD-10-CM | POA: Diagnosis not present

## 2024-07-06 DIAGNOSIS — Z90710 Acquired absence of both cervix and uterus: Secondary | ICD-10-CM

## 2024-07-06 DIAGNOSIS — R350 Frequency of micturition: Secondary | ICD-10-CM

## 2024-07-06 LAB — POCT URINALYSIS DIP (CLINITEK)
Bilirubin, UA: NEGATIVE
Glucose, UA: NEGATIVE mg/dL
Ketones, POC UA: NEGATIVE mg/dL
Nitrite, UA: NEGATIVE
POC PROTEIN,UA: NEGATIVE
Spec Grav, UA: 1.01 (ref 1.010–1.025)
Urobilinogen, UA: 0.2 U/dL
pH, UA: 6 (ref 5.0–8.0)

## 2024-07-06 NOTE — Progress Notes (Signed)
 Breast and Pelvic Exam Patient name: Sylvia Kennedy MRN [id number]  Date of birth: [birth year] Chief Complaint:   Gynecologic Exam  History of Present Illness:   Sylvia Kennedy is a 83 y.o. G3P3 Caucasian female being seen today for breast and pelvic exam.  Reports she was just called by her PCP and was advised she had a bladder infection.  Was given Septra and she wants to make sure this is not related to Cipro.  Advised this is not.  She will start on this antibiotic this evening.  Is seen every six months.  She is having a little burning with urination.    Denies vaginal bleeding.     Patient's last menstrual period was 09/16/1993.    Last pap 06/04/2021. Results were: NILM w/ HRHPV not done. Patient has had a hysterectomy. H/O abnormal pap: yes Last mammogram: 06/08/2024. Results were: normal. Family h/o breast cancer: yes maternal aunt. Last colonoscopy: Cologuard done 06/30/2024 and results are still processing. Family h/o colorectal cancer: yes cousin.     07/06/2024    1:05 PM 06/09/2024    9:20 AM 06/10/2022   10:42 AM 06/04/2021    1:46 PM  Depression screen PHQ 2/9  Decreased Interest 1 1 0 0  Down, Depressed, Hopeless 1 1 1  0  PHQ - 2 Score 2 2 1  0  Altered sleeping 0     Tired, decreased energy 0     Change in appetite 0     Feeling bad or failure about yourself  0     Trouble concentrating 0     Moving slowly or fidgety/restless 0     Suicidal thoughts 0     PHQ-9 Score 2       Review of Systems:   Pertinent items are noted in HPI Positive mild burning with urination.   Pertinent History Reviewed:  Reviewed past medical,surgical, social and family history.  Reviewed problem list, medications and allergies. Physical Assessment:   Vitals:   07/06/24 1259  BP: 132/63  Pulse: 66  SpO2: 100%  Weight: 131 lb 9.6 oz (59.7 kg)  Height: 5' 3 (1.6 m)  Body mass index is 23.31 kg/m.        Physical Examination:   General appearance - well appearing, and  in no distress  Mental status - alert, oriented to person, place, and time  Psych:  She has a normal mood and affect  Skin - warm and dry, normal color, no suspicious lesions noted  Chest - effort normal, all lung fields clear to auscultation bilaterally  Heart - normal rate and regular rhythm  Neck:  midline trachea, no thyromegaly or nodules  Breasts - breasts appear normal, no suspicious masses, no skin or nipple changes or  axillary nodes  Abdomen - soft, nontender, nondistended, no masses or organomegaly  Pelvic - VULVA: normal appearing vulva with no masses, tenderness or lesions   VAGINA: atrophic changes CERVIX: surgically absent  Thin prep pap is not indicated  UTERUS: surgically absent   ADNEXA: No adnexal masses or tenderness noted.  Rectal - normal rectal, good sphincter tone, no masses felt.  Extremities:  No swelling or varicosities noted  Chaperone present for exam  Results for orders placed or performed in visit on 07/06/24 (from the past 24 hours)  POCT URINALYSIS DIP (CLINITEK)   Collection Time: 07/06/24  1:11 PM  Result Value Ref Range   Color, UA yellow yellow   Clarity, UA clear clear  Glucose, UA negative negative mg/dL   Bilirubin, UA negative negative   Ketones, POC UA negative negative mg/dL   Spec Grav, UA 8.989 8.989 - 1.025   Blood, UA trace-intact (A) negative   pH, UA 6.0 5.0 - 8.0   POC PROTEIN,UA negative negative, trace   Urobilinogen, UA 0.2 0.2 or 1.0 E.U./dL   Nitrite, UA Negative Negative   Leukocytes, UA Trace (A) Negative    Assessment & Plan:  1. GYN exam for high-risk Medicare patient (Primary) - Pap smear not indicated - Mammogram 05/2024 - Colonoscopy declined.  She recently sent in her cologuard but this hasn't resulted yet - Bone mineral density ordered - lab work done with PCP, Dr. Janey just last week.  Reviewed results as per above.  =( - vaccines reviewed/updated  2. Frequency of urination - POCT URINALYSIS DIP  (CLINITEK)  3. Osteopenia, unspecified location - Dexa scan has been ordered for her to do at [facility]  4. H/O: hysterectomy   Orders Placed This Encounter  Procedures   POCT URINALYSIS DIP (CLINITEK)    Meds: No orders of the defined types were placed in this encounter.   Follow-up: No follow-ups on file.  Ronal GORMAN Pinal, MD 07/06/2024 1:38 PM

## 2024-07-06 NOTE — Patient Instructions (Signed)
 Call [phone] to schedule an appointment at [facility] for your bone density.

## 2024-07-07 LAB — COLOGUARD: COLOGUARD: NEGATIVE

## 2024-07-09 ENCOUNTER — Ambulatory Visit (HOSPITAL_BASED_OUTPATIENT_CLINIC_OR_DEPARTMENT_OTHER): Payer: Self-pay | Admitting: Obstetrics & Gynecology

## 2024-07-13 ENCOUNTER — Other Ambulatory Visit (HOSPITAL_COMMUNITY)
Admission: RE | Admit: 2024-07-13 | Discharge: 2024-07-13 | Disposition: A | Discharge status: Home / Self Care | Source: Ambulatory Visit | Attending: Obstetrics & Gynecology | Admitting: Obstetrics & Gynecology

## 2024-07-13 ENCOUNTER — Encounter (HOSPITAL_BASED_OUTPATIENT_CLINIC_OR_DEPARTMENT_OTHER): Payer: Self-pay | Admitting: Obstetrics & Gynecology

## 2024-07-13 ENCOUNTER — Ambulatory Visit (INDEPENDENT_AMBULATORY_CARE_PROVIDER_SITE_OTHER): Admitting: Obstetrics & Gynecology

## 2024-07-13 VITALS — BP 140/70 | HR 87 | Wt 129.6 lb

## 2024-07-13 DIAGNOSIS — R350 Frequency of micturition: Secondary | ICD-10-CM | POA: Diagnosis not present

## 2024-07-13 DIAGNOSIS — N898 Other specified noninflammatory disorders of vagina: Secondary | ICD-10-CM | POA: Insufficient documentation

## 2024-07-13 DIAGNOSIS — Z8619 Personal history of other infectious and parasitic diseases: Secondary | ICD-10-CM | POA: Diagnosis not present

## 2024-07-13 LAB — POCT URINALYSIS DIP (CLINITEK)
Bilirubin, UA: NEGATIVE
Blood, UA: NEGATIVE
Glucose, UA: NEGATIVE mg/dL
Ketones, POC UA: NEGATIVE mg/dL
Nitrite, UA: NEGATIVE
POC PROTEIN,UA: NEGATIVE
Spec Grav, UA: 1.02 (ref 1.010–1.025)
Urobilinogen, UA: 0.2 U/dL
pH, UA: 6 (ref 5.0–8.0)

## 2024-07-13 MED ORDER — NITROFURANTOIN MONOHYD MACRO 100 MG PO CAPS: 100.0000 mg | ORAL_CAPSULE | Freq: Two times a day (BID) | ORAL | 0 refills | Status: DC

## 2024-07-13 MED ORDER — TERCONAZOLE 0.4 % VA CREA: 1.0000 | TOPICAL_CREAM | Freq: Every day | VAGINAL | 0 refills | Status: AC

## 2024-07-13 NOTE — Progress Notes (Signed)
   GYNECOLOGY  VISIT  CC:   dysuria, urinary urgency   HPI: 83 y.o. G3P3 Widowed White or Caucasian female here for possible UTI or vaginitis. Patient reports pelvic discomfort, vaginal itching and burning. She also reports urinary frequency and some nausea.  Had e coli UTI that was diagnosed with Dr. Janey.  She took bactrim twice daily for 5 days.  Symptoms resolved for a day or two and then she started having urinary urgency, dysuria, and irritation symptoms. She did use a day or two of vaginal antifungal cream and then helped but she stopped using it.  She is having vaginal and vulvar irritation as well at this time.  Doesn't feel like she has discharge.  Denies vaginal bleeding.    Patient's last menstrual period was 09/16/1993.  Past Medical History:  Diagnosis Date   Broken toe    Clostridium difficile diarrhea    LBBB (left bundle branch block)    02/2014   Osteopenia    may be starting the prolia  injection/in between osteopenia and osteoporosis    MEDS:  Reviewed in EPIC  ALLERGIES: Ciprofloxacin, Darvon, Escitalopram, Mtx support [cobalamin combinations], and Penicillin g sodium  SH:  non smoker  Review of Systems  Constitutional: Negative.   Genitourinary:        Urinary frequency, vulvar irritation    PHYSICAL EXAMINATION:    BP (!) 140/70 (BP Location: Left Arm, Patient Position: Sitting, Cuff Size: Normal)   Pulse 87   Wt 129 lb 9.6 oz (58.8 kg)   LMP 09/16/1993   SpO2 100%   BMI 22.96 kg/m     General appearance: alert, cooperative and appears stated age Lymph:  no inguinal LAD noted Pelvic: External genitalia:  no lesions, erythema of inner labia majora              Urethra:  normal appearing urethra with no masses, tenderness or lesions              Bartholins and Skenes: normal                 Vagina: atrophic changes, no lesions, whitish discharge present              Vaginitis swab obtained  Chaperone was present for exam.  Assessment/Plan: 1.  Frequency of urination (Primary) - POCT URINALYSIS DIP (CLINITEK) - Urine Culture - Rx sent to pharmacy but she is going to wait and not start this.  She will try topical terazol cream first.  - Nitrofurantoin , macrocrystal-monohydrate, (MACROBID ) 100 MG capsule; Take 1 capsule (100 mg total) by mouth 2 (two) times daily.  Dispense: 10 capsule; Refill: 0   2. Vaginal irritation - terconazole  (TERAZOL 7 ) 0.4 % vaginal cream; Place 1 applicator vaginally at bedtime. Use for 7 nights.  Dispense: 45 g; Refill: 0 - Cervicovaginal ancillary only( [place])  3. History of Clostridioides difficile infection

## 2024-07-14 ENCOUNTER — Ambulatory Visit (HOSPITAL_BASED_OUTPATIENT_CLINIC_OR_DEPARTMENT_OTHER): Payer: Self-pay | Admitting: Obstetrics & Gynecology

## 2024-07-14 LAB — CERVICOVAGINAL ANCILLARY ONLY
Bacterial Vaginitis (gardnerella): NEGATIVE
Candida Glabrata: NEGATIVE
Candida Vaginitis: NEGATIVE
Comment: NEGATIVE
Comment: NEGATIVE
Comment: NEGATIVE

## 2024-07-15 LAB — URINE CULTURE: Organism ID, Bacteria: NO GROWTH

## 2024-08-09 ENCOUNTER — Ambulatory Visit (HOSPITAL_BASED_OUTPATIENT_CLINIC_OR_DEPARTMENT_OTHER)

## 2024-08-09 ENCOUNTER — Other Ambulatory Visit (HOSPITAL_COMMUNITY)
Admission: RE | Admit: 2024-08-09 | Discharge: 2024-08-09 | Disposition: A | Discharge status: Home / Self Care | Source: Ambulatory Visit | Attending: Obstetrics & Gynecology | Admitting: Obstetrics & Gynecology

## 2024-08-09 ENCOUNTER — Encounter (HOSPITAL_BASED_OUTPATIENT_CLINIC_OR_DEPARTMENT_OTHER): Payer: Self-pay

## 2024-08-09 VITALS — BP 144/70 | HR 67

## 2024-08-09 DIAGNOSIS — R300 Dysuria: Secondary | ICD-10-CM | POA: Diagnosis not present

## 2024-08-09 DIAGNOSIS — N898 Other specified noninflammatory disorders of vagina: Secondary | ICD-10-CM | POA: Diagnosis not present

## 2024-08-09 LAB — POCT URINALYSIS DIP (CLINITEK)
Bilirubin, UA: NEGATIVE
Glucose, UA: NEGATIVE mg/dL
Ketones, POC UA: NEGATIVE mg/dL
Leukocytes, UA: NEGATIVE
Nitrite, UA: NEGATIVE
POC PROTEIN,UA: NEGATIVE
Spec Grav, UA: >=1.03 — AB (ref 1.010–1.025)
Urobilinogen, UA: 0.2 U/dL
pH, UA: 6.5 (ref 5.0–8.0)

## 2024-08-09 MED ORDER — NITROFURANTOIN MONOHYD MACRO 100 MG PO CAPS: 100.0000 mg | ORAL_CAPSULE | Freq: Two times a day (BID) | ORAL | 0 refills | Status: AC

## 2024-08-09 NOTE — Progress Notes (Deleted)
 NURSE VISIT- UTI SYMPTOMS   SUBJECTIVE:  Sylvia Kennedy is a 83 y.o. G3P3 female here for UTI symptoms. She is a GYN patient. She reports {Symptoms; LUP:80621}.  OBJECTIVE:  LMP 09/16/1993   Appears well, in no apparent distress  No results found for this or any previous visit (from the past 24 hours).  ASSESSMENT: GYN patient with UTI symptoms and {gen pos wzh:684356} nitrites  PLAN: Visit routed to or discussed with:  Rx sent today: {yes/no:20286} Urine culture {SENT/NOT SENT:21340::sent} Call or return to clinic prn if these symptoms worsen or fail to improve as anticipated. Follow-up: {Blank single:19197::as scheduled,as needed}

## 2024-08-09 NOTE — Progress Notes (Signed)
 NURSE VISIT- UTI SYMPTOMS   SUBJECTIVE:  SIEANNA Kennedy is a 83 y.o. G3P3 female here for UTI symptoms. She is a GYN patient. She reports dysuria. She also reports some vaginal itching and would like to do a swab for yeast infection.  OBJECTIVE:  LMP 09/16/1993   Appears well, in no apparent distress  No results found for this or any previous visit (from the past 24 hours).  ASSESSMENT: GYN patient with UTI symptoms and negative nitrites  PLAN: Visit routed to or discussed with:  Rx sent today: Yes Urine culture sent Call or return to clinic prn if these symptoms worsen or fail to improve as anticipated. Follow-up: as scheduled   Morna LOISE Quale, RN

## 2024-08-10 LAB — CERVICOVAGINAL ANCILLARY ONLY
Bacterial Vaginitis (gardnerella): NEGATIVE
Candida Glabrata: NEGATIVE
Candida Vaginitis: NEGATIVE
Comment: NEGATIVE
Comment: NEGATIVE
Comment: NEGATIVE

## 2024-08-11 ENCOUNTER — Ambulatory Visit (HOSPITAL_BASED_OUTPATIENT_CLINIC_OR_DEPARTMENT_OTHER): Payer: Self-pay | Admitting: Certified Nurse Midwife

## 2024-08-11 LAB — URINE CULTURE: Organism ID, Bacteria: NO GROWTH

## 2024-08-16 ENCOUNTER — Telehealth (HOSPITAL_BASED_OUTPATIENT_CLINIC_OR_DEPARTMENT_OTHER): Payer: Self-pay

## 2024-08-16 NOTE — Telephone Encounter (Signed)
 Called and spoke with patient and advised that results for urine culture and vaginal swab were both negative. No further recommendations at this time. Patient verbalized understanding with no further questions or concerns.   Morna LOISE Quale, RN

## 2024-08-16 NOTE — Telephone Encounter (Signed)
 Pt would like for a nurse to call her about her results; she states she was in last week and she had not heard anything.

## 2024-08-17 ENCOUNTER — Other Ambulatory Visit: Payer: Self-pay | Admitting: Internal Medicine

## 2024-08-17 DIAGNOSIS — K589 Irritable bowel syndrome without diarrhea: Secondary | ICD-10-CM

## 2024-08-17 DIAGNOSIS — R1030 Lower abdominal pain, unspecified: Secondary | ICD-10-CM

## 2024-08-19 ENCOUNTER — Other Ambulatory Visit (HOSPITAL_COMMUNITY): Payer: Self-pay | Admitting: Internal Medicine

## 2024-08-19 DIAGNOSIS — R1030 Lower abdominal pain, unspecified: Secondary | ICD-10-CM

## 2024-08-19 DIAGNOSIS — K589 Irritable bowel syndrome without diarrhea: Secondary | ICD-10-CM

## 2024-08-27 ENCOUNTER — Ambulatory Visit (HOSPITAL_COMMUNITY)
Admission: RE | Admit: 2024-08-27 | Discharge: 2024-08-27 | Disposition: A | Source: Ambulatory Visit | Attending: Internal Medicine | Admitting: Internal Medicine

## 2024-08-27 DIAGNOSIS — K589 Irritable bowel syndrome without diarrhea: Secondary | ICD-10-CM

## 2024-08-27 DIAGNOSIS — R1030 Lower abdominal pain, unspecified: Secondary | ICD-10-CM

## 2024-08-27 MED ORDER — SODIUM CHLORIDE (PF) 0.9 % IJ SOLN
INTRAMUSCULAR | Status: AC
  Filled 2024-08-27: qty 50

## 2024-08-27 MED ORDER — IOHEXOL 300 MG/ML  SOLN
100.0000 mL | Freq: Once | INTRAMUSCULAR | Status: AC | PRN
  Administered 2024-08-27: 80 mL via INTRAVENOUS

## 2024-09-22 NOTE — Progress Notes (Signed)
 Sylvia Kennedy                                          MRN: [id number]   09/22/2024   The [facility] Quality Team Specialist reviewed this patient medical record for the purposes of chart review for care gap closure. The following were reviewed: chart review for care gap closure-controlling blood pressure.    [facility] Quality Team
